# Patient Record
Sex: Female | Born: 1935 | Race: White | Hispanic: No | Smoking: Former smoker
Health system: Southern US, Community
[De-identification: ages and names within clinical notes are randomized; demographics above are authoritative.]

## PROBLEM LIST (undated history)

## (undated) DIAGNOSIS — C44300 Unspecified malignant neoplasm of skin of unspecified part of face: Secondary | ICD-10-CM

## (undated) DIAGNOSIS — F419 Anxiety disorder, unspecified: Secondary | ICD-10-CM

## (undated) DIAGNOSIS — I4891 Unspecified atrial fibrillation: Secondary | ICD-10-CM

## (undated) DIAGNOSIS — G479 Sleep disorder, unspecified: Secondary | ICD-10-CM

## (undated) DIAGNOSIS — I071 Rheumatic tricuspid insufficiency: Secondary | ICD-10-CM

## (undated) DIAGNOSIS — N2889 Other specified disorders of kidney and ureter: Secondary | ICD-10-CM

## (undated) DIAGNOSIS — I420 Dilated cardiomyopathy: Secondary | ICD-10-CM

## (undated) DIAGNOSIS — I340 Nonrheumatic mitral (valve) insufficiency: Secondary | ICD-10-CM

## (undated) HISTORY — PX: APPENDECTOMY: SHX54

## (undated) HISTORY — PX: ABDOMINAL HYSTERECTOMY: SHX81

## (undated) HISTORY — DX: Nonrheumatic mitral (valve) insufficiency: I34.0

## (undated) HISTORY — DX: Unspecified atrial fibrillation: I48.91

## (undated) HISTORY — DX: Rheumatic tricuspid insufficiency: I07.1

## (undated) HISTORY — DX: Dilated cardiomyopathy: I42.0

## (undated) HISTORY — DX: Sleep disorder, unspecified: G47.9

## (undated) HISTORY — DX: Unspecified malignant neoplasm of skin of unspecified part of face: C44.300

## (undated) HISTORY — DX: Anxiety disorder, unspecified: F41.9

## (undated) HISTORY — DX: Other specified disorders of kidney and ureter: N28.89

---

## 2012-02-19 DIAGNOSIS — I4891 Unspecified atrial fibrillation: Secondary | ICD-10-CM

## 2012-02-19 DIAGNOSIS — I420 Dilated cardiomyopathy: Secondary | ICD-10-CM

## 2012-02-19 DIAGNOSIS — N2889 Other specified disorders of kidney and ureter: Secondary | ICD-10-CM

## 2012-02-19 HISTORY — DX: Other specified disorders of kidney and ureter: N28.89

## 2012-02-19 HISTORY — DX: Dilated cardiomyopathy: I42.0

## 2012-02-19 HISTORY — DX: Unspecified atrial fibrillation: I48.91

## 2012-03-01 ENCOUNTER — Inpatient Hospital Stay: Payer: Self-pay | Admitting: Specialist

## 2012-03-01 LAB — URINALYSIS, COMPLETE
Bacteria: NONE SEEN
Bilirubin,UR: NEGATIVE
Blood: NEGATIVE
Glucose,UR: NEGATIVE mg/dL (ref 0–75)
Ketone: NEGATIVE
Leukocyte Esterase: NEGATIVE
Squamous Epithelial: <1
WBC UR: 1 /HPF (ref 0–5)

## 2012-03-01 LAB — COMPREHENSIVE METABOLIC PANEL
Alkaline Phosphatase: 90 U/L (ref 50–136)
BUN: 13 mg/dL (ref 7–18)
Calcium, Total: 8.9 mg/dL (ref 8.5–10.1)
Co2: 30 mmol/L (ref 21–32)
Creatinine: 0.81 mg/dL (ref 0.60–1.30)
EGFR (African American): >60
Glucose: 118 mg/dL — ABNORMAL HIGH (ref 65–99)
Potassium: 4.1 mmol/L (ref 3.5–5.1)
SGPT (ALT): 24 U/L

## 2012-03-01 LAB — DRUG SCREEN, URINE
Benzodiazepine, Ur Scrn: NEGATIVE (ref ?–200)
Cocaine Metabolite,Ur ~~LOC~~: NEGATIVE (ref ?–300)
Methadone, Ur Screen: NEGATIVE (ref ?–300)
Opiate, Ur Screen: NEGATIVE (ref ?–300)
Phencyclidine (PCP) Ur S: NEGATIVE (ref ?–25)
Tricyclic, Ur Screen: NEGATIVE (ref ?–1000)

## 2012-03-01 LAB — CBC
HGB: 12.8 g/dL (ref 12.0–16.0)
MCHC: 36.7 g/dL — ABNORMAL HIGH (ref 32.0–36.0)
MCV: 91 fL (ref 80–100)
RBC: 3.83 10*6/uL (ref 3.80–5.20)
RDW: 16.9 % — ABNORMAL HIGH (ref 11.5–14.5)
WBC: 6.3 10*3/uL (ref 3.6–11.0)

## 2012-03-01 LAB — APTT: Activated PTT: 28 secs (ref 23.6–35.9)

## 2012-03-01 LAB — ETHANOL: Ethanol %: <0.003 % (ref 0.000–0.080)

## 2012-03-01 LAB — TROPONIN I
Troponin-I: 0.02 ng/mL
Troponin-I: 0.04 ng/mL

## 2012-03-01 LAB — PROTIME-INR: INR: 1

## 2012-03-01 LAB — CK TOTAL AND CKMB (NOT AT ARMC): CK-MB: 2.6 ng/mL (ref 0.5–3.6)

## 2012-03-02 ENCOUNTER — Ambulatory Visit: Payer: Self-pay | Admitting: Urology

## 2012-03-02 LAB — CK TOTAL AND CKMB (NOT AT ARMC)
CK, Total: 25 U/L (ref 21–215)
CK-MB: 2.4 ng/mL (ref 0.5–3.6)

## 2012-03-02 LAB — CBC WITH DIFFERENTIAL/PLATELET
Basophil %: 0.7 %
Eosinophil #: 0 10*3/uL (ref 0.0–0.7)
Eosinophil %: 0.7 %
HCT: 30.4 % — ABNORMAL LOW (ref 35.0–47.0)
HGB: 10.8 g/dL — ABNORMAL LOW (ref 12.0–16.0)
Lymphocyte #: 0.6 10*3/uL — ABNORMAL LOW (ref 1.0–3.6)
MCHC: 35.4 g/dL (ref 32.0–36.0)
Monocyte #: 1.1 x10 3/mm — ABNORMAL HIGH (ref 0.2–0.9)
Neutrophil %: 68 %
RBC: 3.27 10*6/uL — ABNORMAL LOW (ref 3.80–5.20)
RDW: 17.1 % — ABNORMAL HIGH (ref 11.5–14.5)
WBC: 5.5 10*3/uL (ref 3.6–11.0)

## 2012-03-02 LAB — LIPID PANEL
Cholesterol: 127 mg/dL (ref 0–200)
HDL Cholesterol: 44 mg/dL (ref 40–60)
Ldl Cholesterol, Calc: 73 mg/dL (ref 0–100)
Triglycerides: 51 mg/dL (ref 0–200)
VLDL Cholesterol, Calc: 10 mg/dL (ref 5–40)

## 2012-03-02 LAB — BASIC METABOLIC PANEL
Anion Gap: 10 (ref 7–16)
Calcium, Total: 8.3 mg/dL — ABNORMAL LOW (ref 8.5–10.1)
Creatinine: 0.9 mg/dL (ref 0.60–1.30)
EGFR (African American): >60
EGFR (Non-African Amer.): 54 — ABNORMAL LOW
Osmolality: 273 (ref 275–301)

## 2012-03-02 LAB — TROPONIN I: Troponin-I: 0.04 ng/mL

## 2012-03-03 LAB — BASIC METABOLIC PANEL
Anion Gap: 8 (ref 7–16)
BUN: 27 mg/dL — ABNORMAL HIGH (ref 7–18)
Chloride: 92 mmol/L — ABNORMAL LOW (ref 98–107)
EGFR (African American): 49 — ABNORMAL LOW
EGFR (Non-African Amer.): 42 — ABNORMAL LOW
Glucose: 98 mg/dL (ref 65–99)
Potassium: 4.5 mmol/L (ref 3.5–5.1)

## 2012-03-06 ENCOUNTER — Emergency Department: Payer: Self-pay | Admitting: *Deleted

## 2012-03-06 LAB — CBC
HCT: 31.5 % — ABNORMAL LOW (ref 35.0–47.0)
HGB: 11.3 g/dL — ABNORMAL LOW (ref 12.0–16.0)
MCHC: 35.9 g/dL (ref 32.0–36.0)
MCV: 93 fL (ref 80–100)
Platelet: 289 10*3/uL (ref 150–440)

## 2012-03-06 LAB — COMPREHENSIVE METABOLIC PANEL
BUN: 33 mg/dL — ABNORMAL HIGH (ref 7–18)
Bilirubin,Total: 0.5 mg/dL (ref 0.2–1.0)
Chloride: 93 mmol/L — ABNORMAL LOW (ref 98–107)
Co2: 36 mmol/L — ABNORMAL HIGH (ref 21–32)
Creatinine: 0.87 mg/dL (ref 0.60–1.30)
Glucose: 106 mg/dL — ABNORMAL HIGH (ref 65–99)
Osmolality: 280 (ref 275–301)
Potassium: 4.3 mmol/L (ref 3.5–5.1)
SGOT(AST): 25 U/L (ref 15–37)
SGPT (ALT): 22 U/L

## 2012-03-06 LAB — PRO B NATRIURETIC PEPTIDE: B-Type Natriuretic Peptide: 4600 pg/mL — ABNORMAL HIGH (ref 0–450)

## 2012-03-07 DIAGNOSIS — N289 Disorder of kidney and ureter, unspecified: Secondary | ICD-10-CM

## 2012-03-07 DIAGNOSIS — G479 Sleep disorder, unspecified: Secondary | ICD-10-CM

## 2012-03-07 DIAGNOSIS — I428 Other cardiomyopathies: Secondary | ICD-10-CM

## 2012-03-07 DIAGNOSIS — I4891 Unspecified atrial fibrillation: Secondary | ICD-10-CM

## 2012-03-21 ENCOUNTER — Encounter: Payer: Self-pay | Admitting: Internal Medicine

## 2012-03-21 DIAGNOSIS — G479 Sleep disorder, unspecified: Secondary | ICD-10-CM | POA: Insufficient documentation

## 2012-03-21 DIAGNOSIS — I428 Other cardiomyopathies: Secondary | ICD-10-CM

## 2012-03-21 DIAGNOSIS — C44300 Unspecified malignant neoplasm of skin of unspecified part of face: Secondary | ICD-10-CM | POA: Insufficient documentation

## 2012-03-21 DIAGNOSIS — I4891 Unspecified atrial fibrillation: Secondary | ICD-10-CM

## 2012-03-21 DIAGNOSIS — N2889 Other specified disorders of kidney and ureter: Secondary | ICD-10-CM | POA: Insufficient documentation

## 2012-03-21 DIAGNOSIS — I420 Dilated cardiomyopathy: Secondary | ICD-10-CM | POA: Insufficient documentation

## 2012-03-22 ENCOUNTER — Telehealth: Payer: Self-pay | Admitting: *Deleted

## 2012-03-22 NOTE — Telephone Encounter (Signed)
Faxed form from [facility] is on your desk.  This is order for meds.

## 2012-03-25 NOTE — Telephone Encounter (Signed)
Hospice standing orders done

## 2012-04-02 ENCOUNTER — Ambulatory Visit (INDEPENDENT_AMBULATORY_CARE_PROVIDER_SITE_OTHER): Payer: Medicare Other | Admitting: Internal Medicine

## 2012-04-02 ENCOUNTER — Encounter: Payer: Self-pay | Admitting: Internal Medicine

## 2012-04-02 VITALS — BP 110/70 | HR 82 | Temp 98.2°F | Ht 63.0 in | Wt 135.0 lb

## 2012-04-02 DIAGNOSIS — I428 Other cardiomyopathies: Secondary | ICD-10-CM

## 2012-04-02 DIAGNOSIS — G479 Sleep disorder, unspecified: Secondary | ICD-10-CM

## 2012-04-02 DIAGNOSIS — C44319 Basal cell carcinoma of skin of other parts of face: Secondary | ICD-10-CM

## 2012-04-02 DIAGNOSIS — C44300 Unspecified malignant neoplasm of skin of unspecified part of face: Secondary | ICD-10-CM

## 2012-04-02 DIAGNOSIS — I420 Dilated cardiomyopathy: Secondary | ICD-10-CM

## 2012-04-02 DIAGNOSIS — N2889 Other specified disorders of kidney and ureter: Secondary | ICD-10-CM

## 2012-04-02 DIAGNOSIS — I4891 Unspecified atrial fibrillation: Secondary | ICD-10-CM

## 2012-04-02 DIAGNOSIS — G478 Other sleep disorders: Secondary | ICD-10-CM

## 2012-04-02 DIAGNOSIS — N289 Disorder of kidney and ureter, unspecified: Secondary | ICD-10-CM

## 2012-04-02 NOTE — Assessment & Plan Note (Signed)
Not inflamed Would involve at least 2 procedures---she wishes to just watch

## 2012-04-02 NOTE — Assessment & Plan Note (Signed)
Almost certainly RCC but no action is indicated or desired

## 2012-04-02 NOTE — Assessment & Plan Note (Signed)
Trazodone didn't really help 1 temazepam has been effective

## 2012-04-02 NOTE — Assessment & Plan Note (Signed)
Seems to be compensated now No changes needed Has 24/7 care and on hospice now

## 2012-04-02 NOTE — Assessment & Plan Note (Signed)
Good rate control on CCB Aspirin only with skin cancer

## 2012-04-02 NOTE — Progress Notes (Signed)
Subjective:    Patient ID: Elizabeth Glenn, female    DOB: 1914-08-19, 78 y.o.   MRN: [id number]  HPI Here with daughter No problems getting here  Very nice to be home again Still has sitter during day---daughter spends the night  Had some trouble sleeping  Trazodone changed to the temazepam---had bad nights with 2, but has done well with 1 Nocturia x 3  On the oxygen continuously No dyspnea while walking around house or showering (aide helps) Aide gives mostly stand by assist and help with pants in dressing No chest pain No palpitations  Current Outpatient Prescriptions on File Prior to Visit  Medication Sig Dispense Refill  . aspirin 81 MG tablet Take 81 mg by mouth daily.      Marland Kitchen diltiazem (CARDIZEM CD) 180 MG 24 hr capsule Take 180 mg by mouth daily.      . furosemide (LASIX) 20 MG tablet Take 40 mg by mouth daily.      Marland Kitchen HYDROcodone-acetaminophen (NORCO) 5-325 MG per tablet Take 1 tablet by mouth 3 (three) times daily as needed.      . nitroGLYCERIN (NITROSTAT) 0.4 MG SL tablet Place 0.4 mg under the tongue every 5 (five) minutes as needed. For chest pain      . potassium chloride (MICRO-K) 10 MEQ CR capsule Take 10 mEq by mouth daily.      . temazepam (RESTORIL) 15 MG capsule Take 15-30 mg by mouth at bedtime as needed.        No Known Allergies  Past Medical History  Diagnosis Date  . Atrial fibrillation 4/13  . Dilated cardiomyopathy 4/13  . Sleep disturbance   . Left renal mass 4/13  . Mitral regurgitation   . Tricuspid regurgitation   . Skin cancer of face     forehead    Past Surgical History  Procedure Date  . Appendectomy   . Abdominal hysterectomy     No family history on file.  History   Social History  . Marital Status: Widowed    Spouse Name: N/A    Number of Children: 4  . Years of Education: N/A   Occupational History  . Mill worker/grocery     Retired   Social History Main Topics  . Smoking status: Former Smoker    Types: Cigarettes    . Smokeless tobacco: Never Used  . Alcohol Use: No  . Drug Use: No  . Sexually Active: Not on file   Other Topics Concern  . Not on file   Social History Narrative   Widowed 1970 4 children--1 has diedDaughter [name] is health care POADNR done 4/18/13No living willLives alone with daughter there at night and sitter during day   Review of Systems No incontinence unless she waits too long Appetite variable Weight is down some more from [facility] instruction from hospice nurse on reduced salt diet Occ shooting pain in right calf since yesterday---worse this AM. Only lasts for a few minutes    Objective:   Physical Exam  Constitutional: She appears well-developed and well-nourished.  Neck: Normal range of motion.  Cardiovascular: Normal rate and normal heart sounds.  Exam reveals no gallop.   No murmur heard.      irregular  Pulmonary/Chest: Effort normal and breath sounds normal. No respiratory distress. She has no wheezes. She has no rales.  Abdominal: Soft. There is no tenderness.  Musculoskeletal: She exhibits edema.       Trace edema  No calf tenderness  Lymphadenopathy:    She has no cervical adenopathy.  Skin:       Large skin cancer on forehead---not inflamed  Psychiatric: She has a normal mood and affect. Her behavior is normal.          Assessment & Plan:

## 2012-04-10 ENCOUNTER — Telehealth: Payer: Self-pay | Admitting: Internal Medicine

## 2012-04-10 NOTE — Telephone Encounter (Signed)
Phone call from Elizabeth Glenn She has lost 36.5# over 18 days Appears to have cleared all edema Still feels okay  Asks about the furosemide Will change to prn if weight over 130# Check met b

## 2012-04-18 ENCOUNTER — Encounter: Payer: Self-pay | Admitting: Internal Medicine

## 2012-04-25 ENCOUNTER — Encounter: Payer: Self-pay | Admitting: *Deleted

## 2012-04-26 ENCOUNTER — Telehealth: Payer: Self-pay | Admitting: Internal Medicine

## 2012-04-26 NOTE — Telephone Encounter (Signed)
Call from [facility] nurse Still not sleeping despite trying trazodone and temazepam  Will try lorazepam---out of box---will send Rx if effective

## 2012-05-02 ENCOUNTER — Telehealth: Payer: Self-pay | Admitting: Internal Medicine

## 2012-05-02 NOTE — Telephone Encounter (Signed)
Call from [facility] ---hospice nurse Didn't sleep with the lorazepam They have had some success with elavil for restless legs--which she seems to have I okayed nortriptylline 25 which is safer at her age Can still use the temazepam prn

## 2012-05-27 ENCOUNTER — Telehealth: Payer: Self-pay | Admitting: Internal Medicine

## 2012-05-27 NOTE — Telephone Encounter (Signed)
Sue Lush with [facility] said that since Thursday patient has had foul odor with urine, chills, freq urine at night, and would like to know if they can do a U/A.

## 2012-05-27 NOTE — Telephone Encounter (Signed)
Discussed with Sue Lush Given potential systemic symptoms, will check urine but also start empiric cipro 250mg  bid (#20 x 0) Will stop if urine culture is negative

## 2012-05-29 ENCOUNTER — Encounter: Payer: Self-pay | Admitting: Internal Medicine

## 2012-06-03 ENCOUNTER — Telehealth: Payer: Self-pay | Admitting: Internal Medicine

## 2012-06-03 NOTE — Telephone Encounter (Signed)
Call from [facility] nurse Still not sleeping well--combination of insomnia and restless legs Lorazepam, amitriptylline, nortriptylline and vicodin not effective Some help from temazepam  I am not excited about requip Will try sinemet CR 25/100  1-2 at bedtime

## 2012-06-20 ENCOUNTER — Other Ambulatory Visit: Payer: Self-pay

## 2012-06-20 NOTE — Telephone Encounter (Signed)
Rx called in 

## 2012-06-20 NOTE — Telephone Encounter (Signed)
Okay #60 x 0 

## 2012-06-20 NOTE — Telephone Encounter (Signed)
Ok to refill 

## 2012-06-21 ENCOUNTER — Other Ambulatory Visit: Payer: Self-pay | Admitting: *Deleted

## 2012-06-21 DIAGNOSIS — R55 Syncope and collapse: Secondary | ICD-10-CM

## 2012-06-21 DIAGNOSIS — I472 Ventricular tachycardia: Secondary | ICD-10-CM

## 2012-06-21 DIAGNOSIS — I428 Other cardiomyopathies: Secondary | ICD-10-CM

## 2012-06-21 MED ORDER — LORAZEPAM 0.5 MG PO TABS: 0.5000 mg | ORAL_TABLET | Freq: Two times a day (BID) | ORAL | Status: DC | PRN

## 2012-06-21 NOTE — Telephone Encounter (Signed)
Medication phoned to pharmacy informing them that this may be a duplicate from yesterday but re-sent in case it did not transmit appropriately.

## 2012-06-21 NOTE — Telephone Encounter (Signed)
This was approved and phoned in yesterday Please check with the pharmacy to be sure they got it

## 2012-06-21 NOTE — Telephone Encounter (Signed)
OK to refill? No refill history on file.

## 2012-06-26 ENCOUNTER — Telehealth: Payer: Self-pay | Admitting: *Deleted

## 2012-06-26 NOTE — Telephone Encounter (Signed)
Hospice nurse calling asking for order to draw Met B on pt, she thinks her potassium is low, per nurse pt has had some syncopal episodes. Please advise  ( she knows your out until tomorrow)

## 2012-06-27 ENCOUNTER — Telehealth: Payer: Self-pay | Admitting: Internal Medicine

## 2012-06-27 NOTE — Telephone Encounter (Signed)
PC from [facility] RN Has had several episodes (4 in past week) of fluttering in chest then syncope Slumps over in chair ---then will recover BP running 116-120/68-76 Has appt next week  For now, will decrease diltiazem to 120 daily Labs next week

## 2012-06-27 NOTE — Telephone Encounter (Signed)
I spoke to her today See other phone note

## 2012-07-04 ENCOUNTER — Encounter: Payer: Self-pay | Admitting: Internal Medicine

## 2012-07-04 ENCOUNTER — Ambulatory Visit (INDEPENDENT_AMBULATORY_CARE_PROVIDER_SITE_OTHER): Payer: Medicare Other | Admitting: Internal Medicine

## 2012-07-04 VITALS — BP 132/70 | HR 90 | Temp 97.8°F | Wt 128.0 lb

## 2012-07-04 DIAGNOSIS — R55 Syncope and collapse: Secondary | ICD-10-CM

## 2012-07-04 DIAGNOSIS — G479 Sleep disorder, unspecified: Secondary | ICD-10-CM

## 2012-07-04 DIAGNOSIS — I420 Dilated cardiomyopathy: Secondary | ICD-10-CM

## 2012-07-04 DIAGNOSIS — I4891 Unspecified atrial fibrillation: Secondary | ICD-10-CM

## 2012-07-04 DIAGNOSIS — I428 Other cardiomyopathies: Secondary | ICD-10-CM

## 2012-07-04 NOTE — Assessment & Plan Note (Signed)
Does best with the temazepam The sinemet seems to have helped also

## 2012-07-04 NOTE — Assessment & Plan Note (Signed)
Rate reasonably controlled ASA only due to bleeding risk---esp with large cancer on forehead

## 2012-07-04 NOTE — Progress Notes (Signed)
Subjective:    Patient ID: Elizabeth Glenn, female    DOB: [birth year], 78 y.o.   MRN: [id number]  HPI Here with Elizabeth Glenn her daughter  Meredeth Ide a little with the walker or cane Ongoing right leg pain and swelling Does use the hydrocodone when needed  Multiple meds for sleep Nothing has worked so well Up three to four times a night to void and doesn't always go back to sleep easily Sinemet seems to help for leg restlessness---they occ give it during the day Does best at night with the temazepam  Last week, she had jumping feeling in chest Had a couple of spells of being unresponsive Daughter woke her by yelling in her face Diltiazem was decreased---no spells since then  Current Outpatient Prescriptions on File Prior to Visit  Medication Sig Dispense Refill  . aspirin 81 MG tablet Take 81 mg by mouth daily.      . carbidopa-levodopa (SINEMET CR) 25-100 MG per tablet Take 1 tablet by mouth 2 (two) times daily as needed. For restless legs      . diltiazem (DILACOR XR) 120 MG 24 hr capsule Take 120 mg by mouth daily.      Marland Kitchen HYDROcodone-acetaminophen (NORCO) 5-325 MG per tablet Take 1 tablet by mouth 3 (three) times daily as needed.      Marland Kitchen LORazepam (ATIVAN) 0.5 MG tablet Take 1 tablet (0.5 mg total) by mouth 2 (two) times daily as needed. For nerves  30 tablet  0  . nitroGLYCERIN (NITROSTAT) 0.4 MG SL tablet Place 0.4 mg under the tongue every 5 (five) minutes as needed. For chest pain      . temazepam (RESTORIL) 15 MG capsule Take 15 mg by mouth at bedtime as needed.        No Known Allergies  Past Medical History  Diagnosis Date  . Atrial fibrillation 4/13  . Dilated cardiomyopathy 4/13  . Sleep disturbance   . Left renal mass 4/13  . Mitral regurgitation   . Tricuspid regurgitation   . Skin cancer of face     forehead    Past Surgical History  Procedure Date  . Appendectomy   . Abdominal hysterectomy     No family history on file.  History   Social History  . Marital  Status: Widowed    Spouse Name: N/A    Number of Children: 4  . Years of Education: N/A   Occupational History  . Mill worker/grocery     Retired   Social History Main Topics  . Smoking status: Former Smoker    Types: Cigarettes  . Smokeless tobacco: Never Used  . Alcohol Use: No  . Drug Use: No  . Sexually Active: Not on file   Other Topics Concern  . Not on file   Social History Narrative   Widowed 1970 4 children--1 has diedDaughter [name] is health care POADNR done 4/18/13No living willLives alone with daughter there at night and sitter during day   Review of Systems Appetite is spotty Has lost a few pounds Not depressed    Objective:   Physical Exam  Constitutional: She appears well-developed and well-nourished. No distress.  HENT:       Same large tumor on forehead  Neck: Normal range of motion. No thyromegaly present.  Cardiovascular: Normal rate and normal heart sounds.  Exam reveals no gallop.   No murmur heard.      irregular  Pulmonary/Chest: Effort normal and breath sounds normal. No respiratory distress. She has  no wheezes. She has no rales.  Abdominal: Soft. There is no tenderness.  Musculoskeletal: She exhibits no edema and no tenderness.  Lymphadenopathy:    She has no cervical adenopathy.  Psychiatric: Her behavior is normal.       Mild anxiety          Assessment & Plan:

## 2012-07-04 NOTE — Assessment & Plan Note (Signed)
Stable but limited functional status

## 2012-07-04 NOTE — Assessment & Plan Note (Signed)
Sounds like hypotension related to increased heart rate Is better on lower diltiazem dose Discussed with daughter getting her supine if repeated spell

## 2012-07-05 LAB — TSH: TSH: 1.51 u[IU]/mL (ref 0.35–5.50)

## 2012-07-05 LAB — BASIC METABOLIC PANEL
BUN: 17 mg/dL (ref 6–23)
Calcium: 9 mg/dL (ref 8.4–10.5)
Creatinine, Ser: 0.5 mg/dL (ref 0.4–1.2)
GFR: 116.07 mL/min (ref >60.00)
Glucose, Bld: 96 mg/dL (ref 70–99)

## 2012-07-05 LAB — HEPATIC FUNCTION PANEL
Albumin: 3.3 g/dL — ABNORMAL LOW (ref 3.5–5.2)
Alkaline Phosphatase: 82 U/L (ref 39–117)
Bilirubin, Direct: 0.1 mg/dL (ref 0.0–0.3)
Total Bilirubin: 0.2 mg/dL — ABNORMAL LOW (ref 0.3–1.2)

## 2012-07-05 LAB — CBC WITH DIFFERENTIAL/PLATELET
Basophils Absolute: 0.1 10*3/uL (ref 0.0–0.1)
Lymphocytes Relative: 24.9 % (ref 12.0–46.0)
Lymphs Abs: 1.5 10*3/uL (ref 0.7–4.0)
Monocytes Relative: 15.4 % — ABNORMAL HIGH (ref 3.0–12.0)
Neutrophils Relative %: 55.6 % (ref 43.0–77.0)
Platelets: 183 10*3/uL (ref 150.0–400.0)
RDW: 14.8 % — ABNORMAL HIGH (ref 11.5–14.6)
WBC: 6 10*3/uL (ref 4.5–10.5)

## 2012-07-09 ENCOUNTER — Encounter: Payer: Self-pay | Admitting: *Deleted

## 2012-07-11 ENCOUNTER — Other Ambulatory Visit: Payer: Self-pay | Admitting: *Deleted

## 2012-07-11 MED ORDER — LORAZEPAM 0.5 MG PO TABS: 0.5000 mg | ORAL_TABLET | Freq: Two times a day (BID) | ORAL | Status: DC | PRN

## 2012-07-11 NOTE — Telephone Encounter (Signed)
rx called into pharmacy

## 2012-07-11 NOTE — Telephone Encounter (Signed)
Okay #60 x 0 

## 2012-07-11 NOTE — Telephone Encounter (Signed)
Last filled 06/20/2012

## 2012-07-24 ENCOUNTER — Other Ambulatory Visit: Payer: Self-pay | Admitting: *Deleted

## 2012-07-24 MED ORDER — TEMAZEPAM 15 MG PO CAPS: ORAL_CAPSULE | ORAL | Status: DC

## 2012-07-24 NOTE — Telephone Encounter (Signed)
Okay to change to 1-2 in instructions and give #60 x 0 Make sure she knows I recommend only 1 in general (but can use the 2 if having trouble)

## 2012-07-24 NOTE — Telephone Encounter (Signed)
The faxed request from the pharmacy has instructions as 1 to 2 capsules by mouth at bedtime while it only says 1 qhs prn in chart

## 2012-07-24 NOTE — Telephone Encounter (Signed)
Rx called in as directed. Patient and caregiver notified to use 2 caps sparingly and to try to use only 1 if possible. They verbalized understanding.

## 2012-08-14 ENCOUNTER — Telehealth: Payer: Self-pay | Admitting: *Deleted

## 2012-08-14 MED ORDER — DILTIAZEM HCL ER COATED BEADS 120 MG PO CP24: 120.0000 mg | ORAL_CAPSULE | Freq: Every day | ORAL | Status: DC

## 2012-08-14 NOTE — Telephone Encounter (Signed)
Okay to make that switch

## 2012-08-14 NOTE — Telephone Encounter (Signed)
Spoke with pharmacist and made the switch, rx sent to pharmacy by e-script

## 2012-08-14 NOTE — Telephone Encounter (Signed)
Pharmacist calling asking to change pt's med Diltiazem XR 120 to Cardizem CD, per pharmacist pt is having a hard time swallowing the Diltiazem because it's thicker, the Cardizem is thinner & the only difference is the Cardizem may fade out closer to the 24hr mark, other than that they are similar. please advise.

## 2012-08-21 ENCOUNTER — Ambulatory Visit: Payer: Self-pay | Admitting: Internal Medicine

## 2012-09-02 ENCOUNTER — Other Ambulatory Visit: Payer: Self-pay | Admitting: *Deleted

## 2012-09-02 MED ORDER — HYDROCODONE-ACETAMINOPHEN 5-325 MG PO TABS: 1.0000 | ORAL_TABLET | Freq: Three times a day (TID) | ORAL | Status: DC | PRN

## 2012-09-02 NOTE — Telephone Encounter (Signed)
LETVAK PATIENT, last filled 03/21/12, ok to fill?

## 2012-09-02 NOTE — Telephone Encounter (Signed)
plz phone in. 

## 2012-09-02 NOTE — Telephone Encounter (Signed)
rx called into pharmacy

## 2012-09-16 ENCOUNTER — Other Ambulatory Visit: Payer: Self-pay | Admitting: Internal Medicine

## 2012-09-17 NOTE — Telephone Encounter (Signed)
Okay #60 x 0 

## 2012-09-17 NOTE — Telephone Encounter (Signed)
rx called into pharmacy

## 2012-09-18 ENCOUNTER — Telehealth: Payer: Self-pay | Admitting: Internal Medicine

## 2012-09-18 ENCOUNTER — Other Ambulatory Visit: Payer: Self-pay | Admitting: Internal Medicine

## 2012-09-18 NOTE — Telephone Encounter (Signed)
Call from [facility] RN Having increased leg problems Tremor and pain---not just at night Doesn't seem like restless legs Hydrocodone and lorazepam some help  Not clear that sinemet helping with sleep  Advised to continue the meds for symptom relief. If ongoing symptoms, will push up appt from the 15th to next week

## 2012-09-19 NOTE — Telephone Encounter (Signed)
Just done Please check with the pharmacy

## 2012-09-20 NOTE — Telephone Encounter (Signed)
Called in Rx as prescribed 

## 2012-09-23 DIAGNOSIS — I428 Other cardiomyopathies: Secondary | ICD-10-CM

## 2012-09-23 DIAGNOSIS — R55 Syncope and collapse: Secondary | ICD-10-CM

## 2012-09-23 DIAGNOSIS — I472 Ventricular tachycardia: Secondary | ICD-10-CM

## 2012-09-24 ENCOUNTER — Ambulatory Visit (INDEPENDENT_AMBULATORY_CARE_PROVIDER_SITE_OTHER): Payer: Medicare Other | Admitting: Internal Medicine

## 2012-09-24 ENCOUNTER — Encounter: Payer: Self-pay | Admitting: Internal Medicine

## 2012-09-24 VITALS — BP 138/80 | HR 84 | Temp 97.9°F | Wt 121.0 lb

## 2012-09-24 DIAGNOSIS — I428 Other cardiomyopathies: Secondary | ICD-10-CM

## 2012-09-24 DIAGNOSIS — N289 Disorder of kidney and ureter, unspecified: Secondary | ICD-10-CM

## 2012-09-24 DIAGNOSIS — F419 Anxiety disorder, unspecified: Secondary | ICD-10-CM | POA: Insufficient documentation

## 2012-09-24 DIAGNOSIS — N2889 Other specified disorders of kidney and ureter: Secondary | ICD-10-CM

## 2012-09-24 DIAGNOSIS — M79609 Pain in unspecified limb: Secondary | ICD-10-CM

## 2012-09-24 DIAGNOSIS — I420 Dilated cardiomyopathy: Secondary | ICD-10-CM

## 2012-09-24 DIAGNOSIS — M79606 Pain in leg, unspecified: Secondary | ICD-10-CM | POA: Insufficient documentation

## 2012-09-24 DIAGNOSIS — F411 Generalized anxiety disorder: Secondary | ICD-10-CM

## 2012-09-24 DIAGNOSIS — I4891 Unspecified atrial fibrillation: Secondary | ICD-10-CM

## 2012-09-24 DIAGNOSIS — Z23 Encounter for immunization: Secondary | ICD-10-CM

## 2012-09-24 MED ORDER — HYDROCODONE-ACETAMINOPHEN 5-325 MG PO TABS: 1.0000 | ORAL_TABLET | Freq: Three times a day (TID) | ORAL | Status: DC | PRN

## 2012-09-24 NOTE — Assessment & Plan Note (Signed)
May be multifactorial ?anxiety base/restless legs Using lorazepam and hydrocodone

## 2012-09-24 NOTE — Assessment & Plan Note (Signed)
Rate is okay now Does go fast at times but seems controlled on the diltiazem On ASA only due to bleeding risk

## 2012-09-24 NOTE — Assessment & Plan Note (Signed)
Still with Class 3 heart failure symptoms Stable fluid status On diltiazem

## 2012-09-24 NOTE — Addendum Note (Signed)
Addended by: Sueanne Margarita on: 09/24/2012 10:18 AM   Modules accepted: Orders

## 2012-09-24 NOTE — Progress Notes (Signed)
Subjective:    Patient ID: Elizabeth Glenn, female    DOB: 24-Jul-1914, 78 y.o.   MRN: [id number]  HPI Here with daughter Doing "half and half"  Having increased problems with legs Moving and don't stop  More of a daytime issue---legs will just shake and have awful pain Trembles all over Usually occurs after anxiety acts up---at least lately Will use the lorazepam and the hydrocodone with some relief  Sleep is still not great Legs not a problem with the sinemet and temazepam  Does get nervous at times The lorazepam does help some  No chest pain Heart seems to run fast at times--feels better if they lie her back then Breathing is okay No sig edema mostly  Walks some in house with walker Hospice bathes her twice a week Needs help dressing or she gets dyspneic Independent with toilet use  Current Outpatient Prescriptions on File Prior to Visit  Medication Sig Dispense Refill  . aspirin 81 MG tablet Take 81 mg by mouth daily.      . carbidopa-levodopa (SINEMET CR) 25-100 MG per tablet Take 1 tablet by mouth 2 (two) times daily as needed. For restless legs      . diltiazem (CARDIZEM CD) 120 MG 24 hr capsule Take 1 capsule (120 mg total) by mouth daily.  30 capsule  11  . HYDROcodone-acetaminophen (NORCO/VICODIN) 5-325 MG per tablet Take 1 tablet by mouth 3 (three) times daily as needed.  30 tablet  0  . LORazepam (ATIVAN) 0.5 MG tablet Take 1 tablet (0.5 mg total) by mouth 2 (two) times daily as needed. For nerves  60 tablet  0  . nitroGLYCERIN (NITROSTAT) 0.4 MG SL tablet Place 0.4 mg under the tongue every 5 (five) minutes as needed. For chest pain      . temazepam (RESTORIL) 15 MG capsule TAKE 1 TO 2 CAPSULES BY MOUTH AT BEDTIME  60 capsule  0    No Known Allergies  Past Medical History  Diagnosis Date  . Atrial fibrillation 4/13  . Dilated cardiomyopathy 4/13  . Sleep disturbance   . Left renal mass 4/13  . Mitral regurgitation   . Tricuspid regurgitation   . Skin cancer  of face     forehead    Past Surgical History  Procedure Date  . Appendectomy   . Abdominal hysterectomy     No family history on file.  History   Social History  . Marital Status: Widowed    Spouse Name: N/A    Number of Children: 4  . Years of Education: N/A   Occupational History  . Mill worker/grocery     Retired   Social History Main Topics  . Smoking status: Former Smoker    Types: Cigarettes  . Smokeless tobacco: Never Used  . Alcohol Use: No  . Drug Use: No  . Sexually Active: Not on file   Other Topics Concern  . Not on file   Social History Narrative   Widowed 1970 4 children--1 has diedDaughter Elizabeth Glenn is health care POADNR done 4/18/13No living willLives alone with daughter there at night and sitter during day   Review of Systems Eating fair--just nibbles per daughter Weight is down 7# since last visit     Objective:   Physical Exam  Constitutional: She appears well-developed. No distress.  Neck: Normal range of motion. Neck supple.  Cardiovascular: Normal rate and normal heart sounds.  Exam reveals no gallop.   No murmur heard.  Irregular Faint distal pulses  Pulmonary/Chest: Effort normal and breath sounds normal. No respiratory distress. She has no wheezes. She has no rales.  Abdominal: Soft. There is no tenderness.  Musculoskeletal: She exhibits no edema and no tenderness.  Lymphadenopathy:    She has no cervical adenopathy.  Psychiatric: She has a normal mood and affect. Her behavior is normal.          Assessment & Plan:

## 2012-09-24 NOTE — Assessment & Plan Note (Signed)
No further evaluation planned

## 2012-09-24 NOTE — Assessment & Plan Note (Signed)
Regular problems that seem to cause the leg pain (or contribute) Using the lorazepam and temazepam at night

## 2012-10-04 ENCOUNTER — Ambulatory Visit: Payer: PRIVATE HEALTH INSURANCE | Admitting: Internal Medicine

## 2012-10-06 ENCOUNTER — Other Ambulatory Visit: Payer: Self-pay | Admitting: Internal Medicine

## 2012-10-07 NOTE — Telephone Encounter (Signed)
rx called into pharmacy

## 2012-10-07 NOTE — Telephone Encounter (Signed)
Okay #60 x 0 

## 2012-10-14 ENCOUNTER — Telehealth: Payer: Self-pay

## 2012-10-14 NOTE — Telephone Encounter (Signed)
Elizabeth Glenn with [facility] left v/m to advise pts certification for hospice is to end on 11/16/12; end of 6 months of care and based on pts diagnosis cardiomyopathy with no significant changes or decline. Pt will need to see Dr Alphonsus Sias 2 weeks prior to 11/16/12 for f/u of pulse ox and need for oxygen at 2.5 L continuously in the home. Any questions or concerns about plan call [facility]. [name], pts daughter to schedule appt for oxygen verification.

## 2012-10-14 NOTE — Telephone Encounter (Signed)
Okay Will need to document oximetry in office to justify oxygen Rx

## 2012-10-24 ENCOUNTER — Other Ambulatory Visit: Payer: Self-pay | Admitting: *Deleted

## 2012-10-24 MED ORDER — MORPHINE SULFATE (CONCENTRATE) 20 MG/ML PO SOLN: ORAL | Status: DC

## 2012-10-24 MED ORDER — HYDROCODONE-ACETAMINOPHEN 5-325 MG PO TABS: 1.0000 | ORAL_TABLET | Freq: Three times a day (TID) | ORAL | Status: DC | PRN

## 2012-10-24 NOTE — Telephone Encounter (Signed)
rx [facility] to [facility] [address]

## 2012-10-30 ENCOUNTER — Other Ambulatory Visit: Payer: Self-pay | Admitting: *Deleted

## 2012-10-30 MED ORDER — CARBIDOPA-LEVODOPA CR 25-100 MG PO TBCR: 1.0000 | EXTENDED_RELEASE_TABLET | Freq: Two times a day (BID) | ORAL | Status: DC | PRN

## 2012-10-30 NOTE — Telephone Encounter (Signed)
Okay #60 x 3 

## 2012-10-30 NOTE — Telephone Encounter (Signed)
rx called into pharmacy

## 2012-10-31 ENCOUNTER — Encounter: Payer: Self-pay | Admitting: Internal Medicine

## 2012-10-31 ENCOUNTER — Ambulatory Visit (INDEPENDENT_AMBULATORY_CARE_PROVIDER_SITE_OTHER): Payer: Self-pay | Admitting: Internal Medicine

## 2012-10-31 VITALS — BP 158/80 | HR 99 | Temp 97.8°F | Wt 120.0 lb

## 2012-10-31 DIAGNOSIS — F419 Anxiety disorder, unspecified: Secondary | ICD-10-CM

## 2012-10-31 DIAGNOSIS — F411 Generalized anxiety disorder: Secondary | ICD-10-CM

## 2012-10-31 DIAGNOSIS — G479 Sleep disorder, unspecified: Secondary | ICD-10-CM

## 2012-10-31 DIAGNOSIS — C44300 Unspecified malignant neoplasm of skin of unspecified part of face: Secondary | ICD-10-CM

## 2012-10-31 DIAGNOSIS — N2889 Other specified disorders of kidney and ureter: Secondary | ICD-10-CM

## 2012-10-31 DIAGNOSIS — I4891 Unspecified atrial fibrillation: Secondary | ICD-10-CM

## 2012-10-31 DIAGNOSIS — I420 Dilated cardiomyopathy: Secondary | ICD-10-CM

## 2012-10-31 DIAGNOSIS — I428 Other cardiomyopathies: Secondary | ICD-10-CM

## 2012-10-31 DIAGNOSIS — N289 Disorder of kidney and ureter, unspecified: Secondary | ICD-10-CM

## 2012-10-31 DIAGNOSIS — C44319 Basal cell carcinoma of skin of other parts of face: Secondary | ICD-10-CM

## 2012-10-31 MED ORDER — DILTIAZEM HCL ER COATED BEADS 180 MG PO TB24: 180.0000 mg | ORAL_TABLET | Freq: Every day | ORAL | Status: AC

## 2012-10-31 NOTE — Assessment & Plan Note (Signed)
Chronic nerves Uses the lorazepam prn

## 2012-10-31 NOTE — Assessment & Plan Note (Signed)
Has restless legs that sinemet, temazepam and hydrocodone seem to be helping Will continue this regimen

## 2012-10-31 NOTE — Assessment & Plan Note (Signed)
Looks about the same

## 2012-10-31 NOTE — Progress Notes (Signed)
Subjective:    Patient ID: Elizabeth Glenn, female    DOB: [birth year], 78 y.o.   MRN: [id number]  HPI Here with daughter Discussed with Marie---hospice nurse Going to keep her on hospice due to recent decline Has had more trouble with her restless legs--- night and day Hydrocodone helps On the temazepam--- will sleep with only 2-3 night awakenings  meds don't seem to be causing sedation  Heart is racing some times She senses it and "it scares me" SOB at times---when too fast Some chest burning but no distinct pain  No ankles swelling Uses wedge to sleep Has had some PND---has to sit straight up  Once or twice a week-- or more  Current Outpatient Prescriptions on File Prior to Visit  Medication Sig Dispense Refill  . aspirin 81 MG tablet Take 81 mg by mouth daily.      . carbidopa-levodopa (SINEMET CR) 25-100 MG per tablet Take 1 tablet by mouth 2 (two) times daily as needed. For restless legs  60 tablet  3  . diltiazem (CARDIZEM CD) 120 MG 24 hr capsule Take 1 capsule (120 mg total) by mouth daily.  30 capsule  11  . HYDROcodone-acetaminophen (NORCO/VICODIN) 5-325 MG per tablet Take 1 tablet by mouth 3 (three) times daily as needed. HOSPICE PATIENT  90 tablet  0  . LORazepam (ATIVAN) 0.5 MG tablet TAKE 1 TABLET TWICE A DAY AS NEEDED FOR NERVES  60 tablet  0  . morphine (ROXANOL) 20 MG/ML concentrated solution Take 5-10 mg by mouth every 1-2 hours as needed for pain HOSPICE PATIENT  30 mL  0  . nitroGLYCERIN (NITROSTAT) 0.4 MG SL tablet Place 0.4 mg under the tongue every 5 (five) minutes as needed. For chest pain      . temazepam (RESTORIL) 15 MG capsule TAKE 1 TO 2 CAPSULES BY MOUTH AT BEDTIME  60 capsule  0    No Known Allergies  Past Medical History  Diagnosis Date  . Atrial fibrillation 4/13  . Dilated cardiomyopathy 4/13  . Sleep disturbance   . Left renal mass 4/13  . Mitral regurgitation   . Tricuspid regurgitation   . Skin cancer of face     forehead  . Anxiety      Past Surgical History  Procedure Date  . Appendectomy   . Abdominal hysterectomy     No family history on file.  History   Social History  . Marital Status: Widowed    Spouse Name: N/A    Number of Children: 4  . Years of Education: N/A   Occupational History  . Mill worker/grocery     Retired   Social History Main Topics  . Smoking status: Former Smoker    Types: Cigarettes  . Smokeless tobacco: Never Used  . Alcohol Use: No  . Drug Use: No  . Sexually Active: Not on file   Other Topics Concern  . Not on file   Social History Narrative   Widowed 1970 4 children--1 has diedDaughter [name] is health care POADNR done 4/18/13No living willLives alone with daughter there at night and sitter during day   Review of Systems Appetite is not very good Weight is down a little more-- 14# down in past 6 months or so    Objective:   Physical Exam  Constitutional: She appears well-developed and well-nourished. No distress.  Neck: Normal range of motion.  Cardiovascular: Normal heart sounds.  Exam reveals no gallop.   No murmur heard.  Irregular and tachycardic ~120 with apical pulse  Pulmonary/Chest: Effort normal and breath sounds normal. No respiratory distress. She has no wheezes. She has no rales.  Abdominal: Soft. There is no tenderness.       No obvious mass  Musculoskeletal: She exhibits no edema and no tenderness.  Lymphadenopathy:    She has no cervical adenopathy.  Psychiatric: She has a normal mood and affect. Her behavior is normal.          Assessment & Plan:

## 2012-10-31 NOTE — Assessment & Plan Note (Signed)
Not sure if this is active No further testing is planned

## 2012-10-31 NOTE — Assessment & Plan Note (Signed)
I suspect her decompensation may be most from the tachycardia No fluid retention Increase in diltiazem

## 2012-10-31 NOTE — Assessment & Plan Note (Signed)
Rapid rate now Clear symptoms Will increase the diltiazem to 180 Will stay on hospice due to decline in function

## 2012-10-31 NOTE — Patient Instructions (Signed)
Please increase the diltiazem to 180mg  per day I will plan to make a home visit in 2-3 months

## 2012-11-01 ENCOUNTER — Telehealth: Payer: Self-pay

## 2012-11-01 NOTE — Telephone Encounter (Signed)
Elizabeth Glenn, pts daughter pt saw Dr Alphonsus Sias 10/31/12; changed BP med. This morning gave pt Diltiazem 24 hr ER 180 mg tab. 15 mins after pt took med at 8:30-9:00 am pt was pounding in chest, could see pts heart beating in her chest, pt said was very sick, weak voice, pt could not stay awake, nausea for 1 -1.5 hours. Pt went back to sleep and now pt is feeling OK and acting normal. If pts condition changes or worsens [name] will call office back. [name] wants to know if should give Diltiazem again.[facility]. [name] request call back.

## 2012-11-01 NOTE — Telephone Encounter (Signed)
Please let her know this is the same medicine that she has been on, just a higher dose.  She might want to check with the pharmacist to see if it is a different formulation that could cause a problem She should continue the med though

## 2012-11-01 NOTE — Telephone Encounter (Signed)
Spoke with daughter and advised results. She will call if any problems.

## 2012-11-15 ENCOUNTER — Other Ambulatory Visit: Payer: Self-pay | Admitting: Internal Medicine

## 2012-11-16 NOTE — Telephone Encounter (Signed)
Okay #60 x 0 

## 2012-11-18 NOTE — Telephone Encounter (Signed)
rx called into pharmacy

## 2012-11-25 ENCOUNTER — Other Ambulatory Visit: Payer: Self-pay | Admitting: Internal Medicine

## 2012-11-25 DIAGNOSIS — I472 Ventricular tachycardia: Secondary | ICD-10-CM

## 2012-11-25 DIAGNOSIS — I428 Other cardiomyopathies: Secondary | ICD-10-CM

## 2012-11-25 DIAGNOSIS — R55 Syncope and collapse: Secondary | ICD-10-CM

## 2012-11-25 NOTE — Telephone Encounter (Signed)
Okay #90 x 0 

## 2012-11-25 NOTE — Telephone Encounter (Signed)
rx called into pharmacy

## 2012-12-23 ENCOUNTER — Ambulatory Visit: Payer: Medicare Other | Admitting: Internal Medicine

## 2012-12-25 ENCOUNTER — Ambulatory Visit: Payer: Medicare Other | Admitting: Internal Medicine

## 2012-12-31 ENCOUNTER — Other Ambulatory Visit: Payer: Self-pay | Admitting: *Deleted

## 2012-12-31 NOTE — Telephone Encounter (Signed)
Last filled 10/23/13 

## 2013-01-01 MED ORDER — HYDROCODONE-ACETAMINOPHEN 5-325 MG PO TABS: 1.0000 | ORAL_TABLET | Freq: Three times a day (TID) | ORAL | Status: DC | PRN

## 2013-01-01 NOTE — Telephone Encounter (Signed)
Okay #90 x 0 

## 2013-01-01 NOTE — Telephone Encounter (Signed)
rx called into pharmacy

## 2013-01-03 ENCOUNTER — Telehealth: Payer: Self-pay | Admitting: Internal Medicine

## 2013-01-03 MED ORDER — MORPHINE SULFATE (CONCENTRATE) 20 MG/ML PO SOLN: ORAL | Status: AC

## 2013-01-03 MED ORDER — MORPHINE SULFATE ER 15 MG PO TBCR: 15.0000 mg | EXTENDED_RELEASE_TABLET | Freq: Two times a day (BID) | ORAL | Status: DC

## 2013-01-03 NOTE — Telephone Encounter (Signed)
Call from [facility] RN  Having more pain--chest and other. Different for her Hydrocodone not helping roxanol did help but needs long acting---[facility]  [facility]  [phone]

## 2013-01-15 ENCOUNTER — Encounter: Payer: Self-pay | Admitting: Internal Medicine

## 2013-01-15 ENCOUNTER — Ambulatory Visit (INDEPENDENT_AMBULATORY_CARE_PROVIDER_SITE_OTHER): Payer: Self-pay | Admitting: Internal Medicine

## 2013-01-15 VITALS — BP 128/78 | HR 80 | Resp 16 | Wt 98.0 lb

## 2013-01-15 DIAGNOSIS — F419 Anxiety disorder, unspecified: Secondary | ICD-10-CM

## 2013-01-15 DIAGNOSIS — I420 Dilated cardiomyopathy: Secondary | ICD-10-CM

## 2013-01-15 DIAGNOSIS — C44319 Basal cell carcinoma of skin of other parts of face: Secondary | ICD-10-CM

## 2013-01-15 DIAGNOSIS — F05 Delirium due to known physiological condition: Secondary | ICD-10-CM | POA: Insufficient documentation

## 2013-01-15 DIAGNOSIS — C44300 Unspecified malignant neoplasm of skin of unspecified part of face: Secondary | ICD-10-CM

## 2013-01-15 DIAGNOSIS — I428 Other cardiomyopathies: Secondary | ICD-10-CM

## 2013-01-15 DIAGNOSIS — F411 Generalized anxiety disorder: Secondary | ICD-10-CM

## 2013-01-15 DIAGNOSIS — G479 Sleep disorder, unspecified: Secondary | ICD-10-CM

## 2013-01-15 DIAGNOSIS — I4891 Unspecified atrial fibrillation: Secondary | ICD-10-CM

## 2013-01-15 NOTE — Assessment & Plan Note (Signed)
Drains a little but no substantial difference

## 2013-01-15 NOTE — Assessment & Plan Note (Signed)
With apparent anginal type syndrome Overall decline  Pain/dyspnea better with the morphine ?side effects with the long acting (though lower dose than the roxanol she had been giving)

## 2013-01-15 NOTE — Assessment & Plan Note (Signed)
Has had stress with sister's death She is the last one in the family left--had hoped to die with her sister Ready to go now (but not really depressed)

## 2013-01-15 NOTE — Assessment & Plan Note (Signed)
Heart rate is better now Will continue current diltiazem

## 2013-01-15 NOTE — Assessment & Plan Note (Addendum)
Could she have had concussion with last week's fall? Related to starting long acting morphine?  Can try with just the roxanol for 2 days just to be sure Try to limit the lorazepam first

## 2013-01-15 NOTE — Progress Notes (Signed)
Subjective:    Patient ID: Elizabeth Glenn, female    DOB: Dec 24, 1913, 79 y.o.   MRN: [id number]  HPI First home visit Daughter is here and 2 of her caregivers Marie--hospice nurse Has 24 hour care---3 caregivers share 9 hours per day---family here other times  Has continued to lose weight Not eating great Drinks ensure regularly--discussed adding ice cream (and they have added egg beaters for protein)  Had been having more dyspnea Chest and other pains---getting racing and fluttering in chest Some leg pain as well Did seem to improve with the roxanol See phone note---started the long acting morphine Fell 3 days after starting the morphine ER--tripped on oxygen cord at night. Then "out of it" the next morning Got confused after that---slept a lot again after that. Then had a good day 2 days ago (may have gotten a lot of the extra morphine in addition to the ER) Bad day yesterday  Memory has been spotty Forgets she had a bath soon after having it Now with some incontinence---has resisted getting care  Stressful time lately Lost her last sister and travelled to [place] for the funeral recently "I am ready to go"  Not walking well--needs help. Assisted to bathroom---needs help even when getting there Stand by assist for dressing (and sometimes more) Clearly getting weaker  Current Outpatient Prescriptions on File Prior to Visit  Medication Sig Dispense Refill  . aspirin 81 MG tablet Take 81 mg by mouth daily.      Marland Kitchen diltiazem (CARDIZEM LA) 180 MG 24 hr tablet Take 1 tablet (180 mg total) by mouth daily.  30 tablet  11  . LORazepam (ATIVAN) 0.5 MG tablet TAKE 1 TABLET TWICE A DAY AS NEEDED FOR NERVES  60 tablet  0  . morphine (MS CONTIN) 15 MG 12 hr tablet Take 1 tablet (15 mg total) by mouth 2 (two) times daily.  30 tablet  0  . morphine (ROXANOL) 20 MG/ML concentrated solution Take 5-10 mg by mouth every 1-2 hours as needed for pain HOSPICE PATIENT  30 mL  0  . nitroGLYCERIN  (NITROSTAT) 0.4 MG SL tablet Place 0.4 mg under the tongue every 5 (five) minutes as needed. For chest pain      . temazepam (RESTORIL) 15 MG capsule TAKE 1 TO 2 CAPSULES BY MOUTH EVERY NIGHT  60 capsule  0   No current facility-administered medications on file prior to visit.    No Known Allergies  Past Medical History  Diagnosis Date  . Atrial fibrillation 4/13  . Dilated cardiomyopathy 4/13  . Sleep disturbance   . Left renal mass 4/13  . Mitral regurgitation   . Tricuspid regurgitation   . Skin cancer of face     forehead  . Anxiety     Past Surgical History  Procedure Laterality Date  . Appendectomy    . Abdominal hysterectomy      No family history on file.  History   Social History  . Marital Status: Widowed    Spouse Name: N/A    Number of Children: 4  . Years of Education: N/A   Occupational History  . Mill worker/grocery     Retired   Social History Main Topics  . Smoking status: Former Smoker    Types: Cigarettes  . Smokeless tobacco: Never Used  . Alcohol Use: No  . Drug Use: No  . Sexually Active: Not on file   Other Topics Concern  . Not on file   Social History  Narrative   Widowed 1970    4 children--1 has died   Daughter [name] is health care POA   DNR done 03/07/12   No living will      Lives alone with daughter there at night and sitter during day   Review of Systems Rash on bottom has cleared---used dermacloud till then Uses senna -s to help bowels since being on increased narcotics Sleeping better but still has difficulty    Objective:   Physical Exam  Constitutional: No distress.  HOH but engages normally  Neck: No thyromegaly present.  Cardiovascular: Normal rate.  Exam reveals no gallop.   No murmur heard. irregular  Pulmonary/Chest: Effort normal. No respiratory distress. She has no wheezes. She has no rales.  Decreased breath sounds at bases but no dullness to percussion  Abdominal: Soft. There is no  tenderness.  Lymphadenopathy:    She has no cervical adenopathy.  Neurological:  Generalized weakness Normal tone  Skin:  Forehead skin cancer without sig change  Psychiatric:  Feels ready for her time but not really depressed          Assessment & Plan:

## 2013-01-15 NOTE — Assessment & Plan Note (Signed)
Has done better with the temazepam

## 2013-01-16 ENCOUNTER — Telehealth: Payer: Self-pay

## 2013-01-16 NOTE — Telephone Encounter (Signed)
Left message with results, advised to call if any problems

## 2013-01-16 NOTE — Telephone Encounter (Signed)
Lillia Abed with [facility] left v/m for Dr Alphonsus Sias; Hilda Lias reweighed pt 01/15/13 and weight was 105 lbs. Pt's caregiver told Hilda Lias after Dr Alphonsus Sias left that pt was having foul smelling and dark urine. Hilda Lias will collect a urine specimen today for urinalysis and C/S and will fax results to Dr Alphonsus Sias. Hilda Lias does not need call back.

## 2013-01-16 NOTE — Telephone Encounter (Signed)
Okay Let her know that I am at a conference tomorrow but she can page me with results if Rx is indicated

## 2013-01-21 ENCOUNTER — Other Ambulatory Visit: Payer: Self-pay | Admitting: Family Medicine

## 2013-01-21 MED ORDER — MORPHINE SULFATE ER 15 MG PO TBCR: 15.0000 mg | EXTENDED_RELEASE_TABLET | Freq: Two times a day (BID) | ORAL | Status: DC

## 2013-01-21 NOTE — Telephone Encounter (Signed)
rx faxed to pharmacy manually Spoke with hospice nurse and advised results

## 2013-01-21 NOTE — Telephone Encounter (Signed)
Please fax right to pharmacy

## 2013-01-21 NOTE — Telephone Encounter (Signed)
Elizabeth Glenn from Hospice received message from pt's daughter this AM stating that pt has taken last dose of MS Contin and needs refill sent today.  She has no more medication for her evening dose.

## 2013-01-28 ENCOUNTER — Telehealth: Payer: Self-pay

## 2013-01-28 NOTE — Telephone Encounter (Signed)
Crystal with [facility] left v/m f/u on faxed urinalysis & urine culture report to Dr Alphonsus Sias. Crystal wants to verify that Dr Alphonsus Sias received the lab results.Please advise.

## 2013-01-30 ENCOUNTER — Other Ambulatory Visit: Payer: Self-pay | Admitting: *Deleted

## 2013-01-30 MED ORDER — TEMAZEPAM 15 MG PO CAPS: ORAL_CAPSULE | ORAL | Status: AC

## 2013-01-30 NOTE — Telephone Encounter (Signed)
Please call in

## 2013-01-30 NOTE — Telephone Encounter (Signed)
Last filled 12/29/12

## 2013-01-31 ENCOUNTER — Other Ambulatory Visit: Payer: Self-pay

## 2013-01-31 MED ORDER — MORPHINE SULFATE ER 15 MG PO TBCR: 15.0000 mg | EXTENDED_RELEASE_TABLET | Freq: Two times a day (BID) | ORAL | Status: AC

## 2013-01-31 NOTE — Telephone Encounter (Signed)
Spoke with cystal hospice nurse and advised results, she will try and get another UA & culture, I asked her to have the results faxed to [phone] so I may get them quicker.

## 2013-01-31 NOTE — Telephone Encounter (Signed)
Med printed to fax for hospice Looks like her ua was abn but then ucx showed mixed urogenital flora, which indicates contamination

## 2013-01-31 NOTE — Telephone Encounter (Signed)
Dr.Tower could you also look at the UA & culture results for this patient of Dr.Asad Keeven's , per hospice they originally faxed over these results on 01/20/2013, we did not receive them and they are wanting to know something ASAP, please advise  Results on your desk

## 2013-01-31 NOTE — Telephone Encounter (Signed)
SEE REFILL REQUEST/ UA/ CULTURE RESULTS PHONE NOTE DATED 01/31/2013

## 2013-01-31 NOTE — Telephone Encounter (Signed)
Rx phoned to pharmacy.  

## 2013-01-31 NOTE — Telephone Encounter (Signed)
Elizabeth Glenn with [facility] request refill morphine 15 mg sent to [facility]. Pt's family said will run out before end of weekend. Elizabeth Glenn request sent in today and Elizabeth Glenn request call back when done.Please advise.

## 2013-02-01 NOTE — Telephone Encounter (Signed)
Unless she was having specific urinary complaints---dysuria, hematuria, etc---I would not have recommended repeating the urine testing

## 2013-02-03 DIAGNOSIS — I428 Other cardiomyopathies: Secondary | ICD-10-CM

## 2013-02-03 DIAGNOSIS — R55 Syncope and collapse: Secondary | ICD-10-CM

## 2013-02-03 DIAGNOSIS — I472 Ventricular tachycardia: Secondary | ICD-10-CM

## 2013-02-03 NOTE — Telephone Encounter (Signed)
Per Crystal pt is confused and she already collected the urine

## 2013-02-04 ENCOUNTER — Telehealth: Payer: Self-pay | Admitting: Internal Medicine

## 2013-02-04 ENCOUNTER — Encounter: Payer: Self-pay | Admitting: Internal Medicine

## 2013-02-04 NOTE — Telephone Encounter (Signed)
Phone call from Hilda Lias the hospice nurse She has worsened More agitated---may be terminal restlessness Being transferred today to the [facility]

## 2013-02-18 DEATH — deceased

## 2013-03-31 IMAGING — CT CT CHEST W/ CM
1 of 2 series · 15 of 32 positions shown, 19 images · IV contrast (agent unspecified)
Comparison: None

REASON FOR EXAM: left sided effusion vs infiltrate
COMMENTS:

PROCEDURE:     CT  - CT CHEST WITH CONTRAST  - March 01, 2012  [DATE]
RESULT:     Indication: Left-sided pleural effusion
TECHNIQUE: Multiple axial images of the chest are obtained with 50 mL of
Csovue-WVV intravenous contrast.

[Series 2: soft tissue · axial · 0.62mm/px · z∈[-286,-32]mm · 15 of 57 slices shown, 19 images]
[im 3/57  soft-tissue]
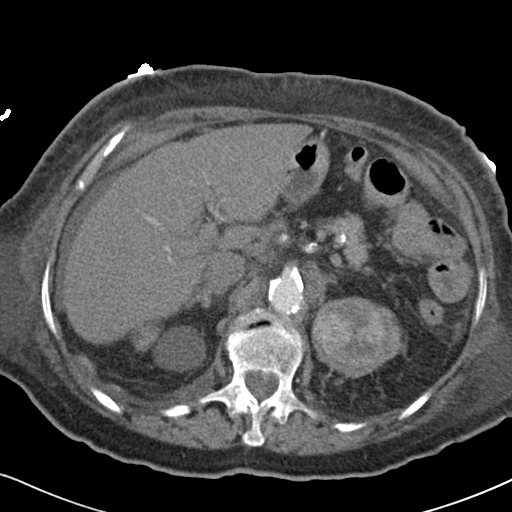
[im 3/57  bone]
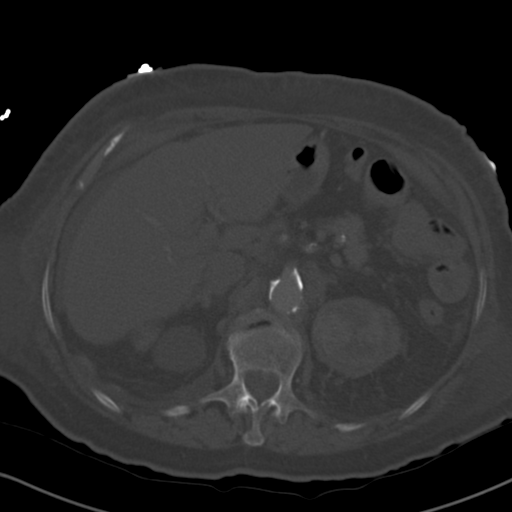
[im 7/57  soft-tissue]
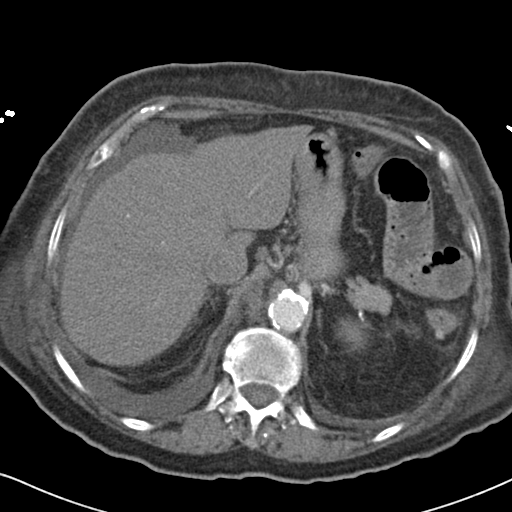
[im 11/57  soft-tissue]
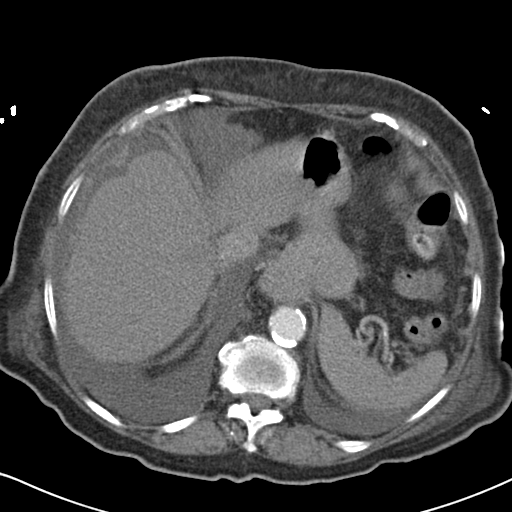
[im 16/57  soft-tissue]
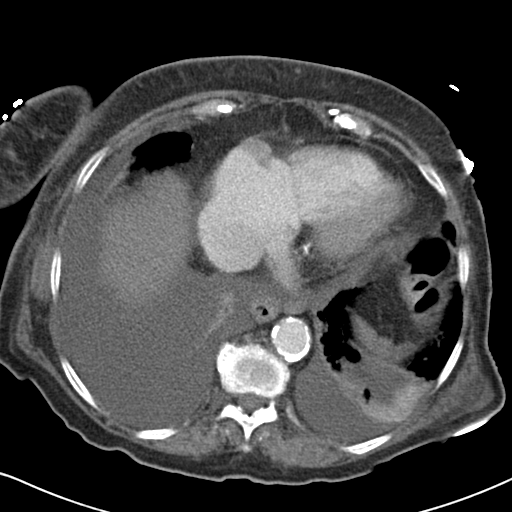
[im 20/57  soft-tissue]
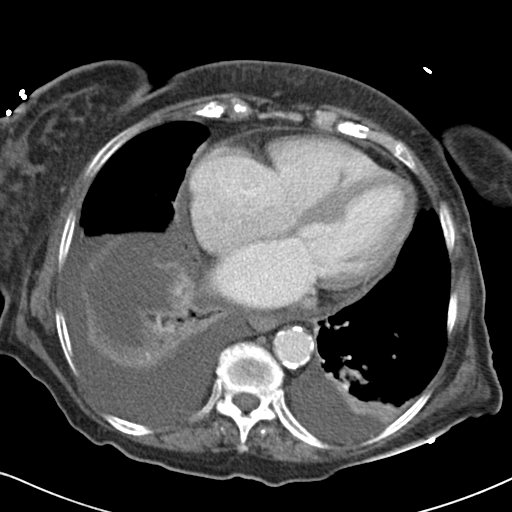
[im 24/57  soft-tissue]
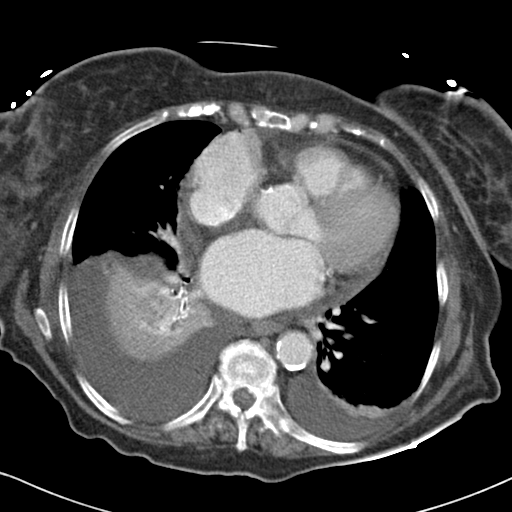
[im 29/57  soft-tissue]
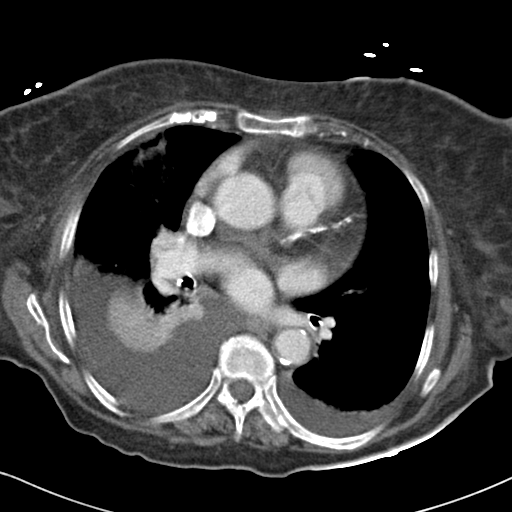
[im 33/57  soft-tissue]
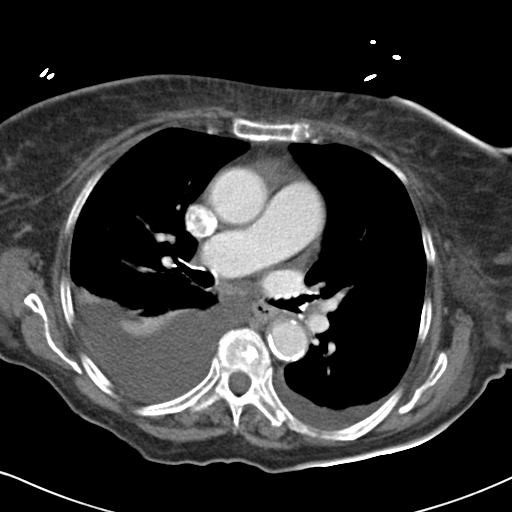
[im 37/57  soft-tissue]
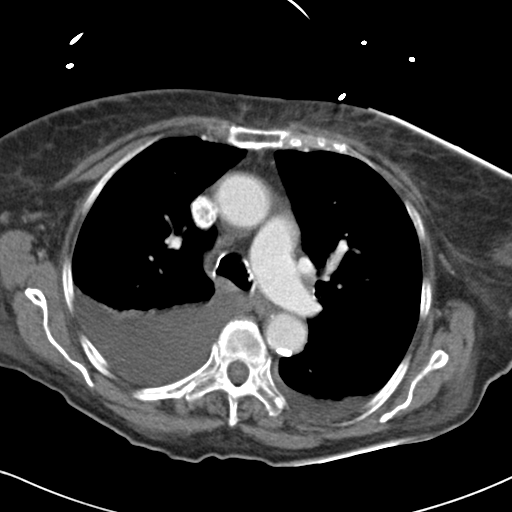
[im 37/57  bone]
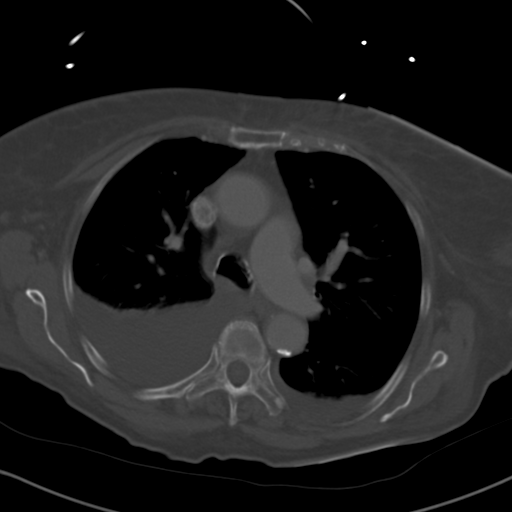
[im 41/57  soft-tissue]
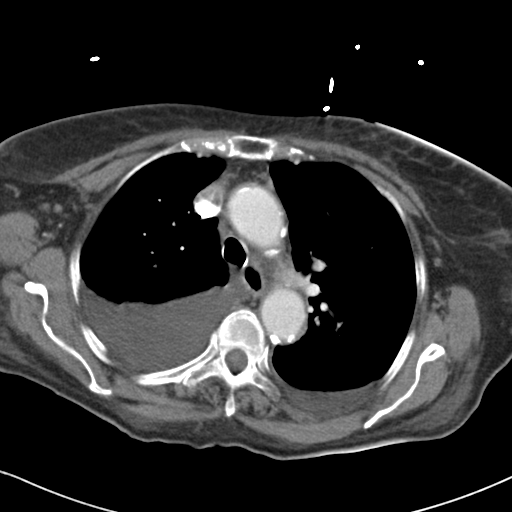
[im 46/57  soft-tissue]
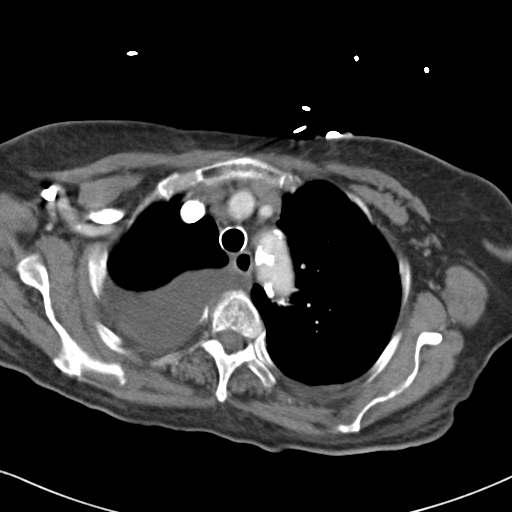
[im 48/57  lung]
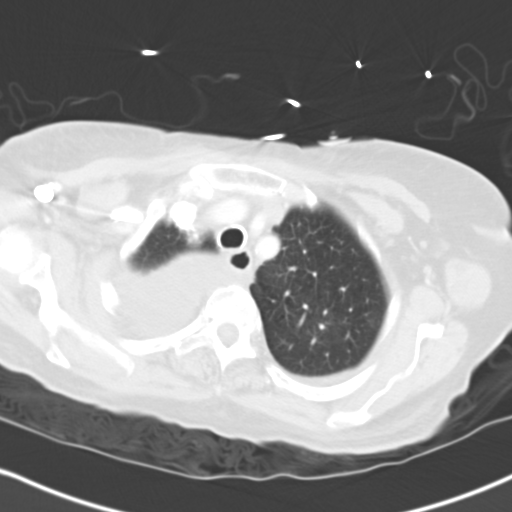
[im 50/57  soft-tissue]
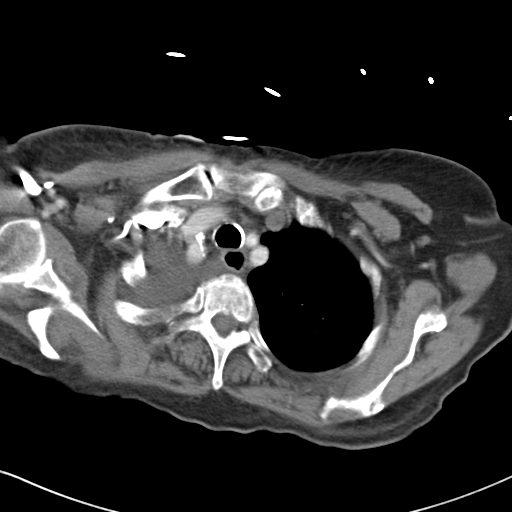
[im 50/57  lung]
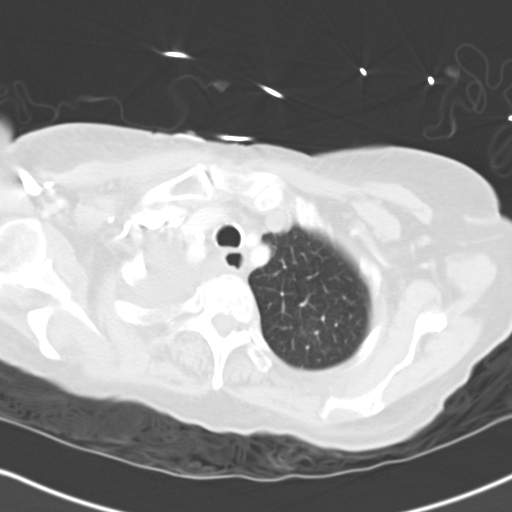
[im 52/57  lung]
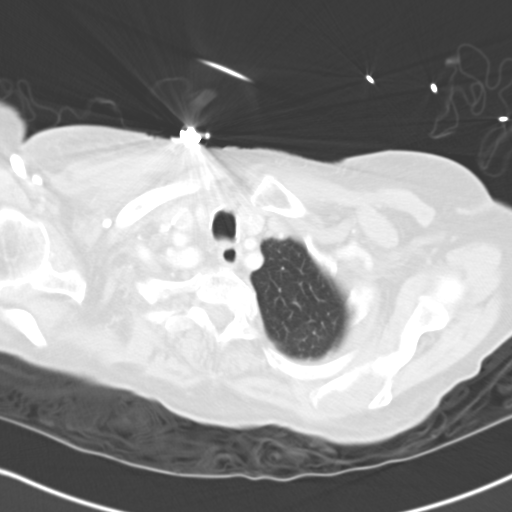
[im 54/57  soft-tissue]
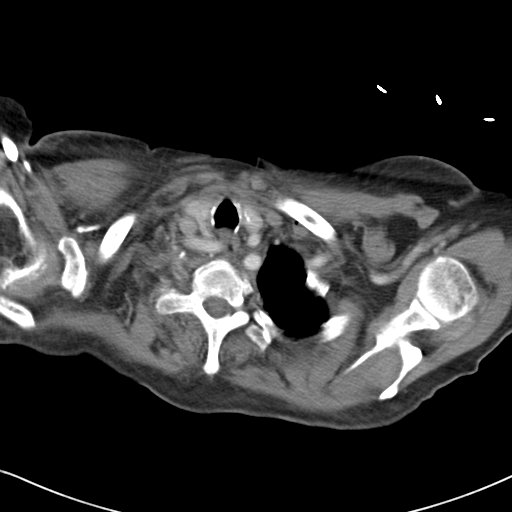
[im 54/57  lung]
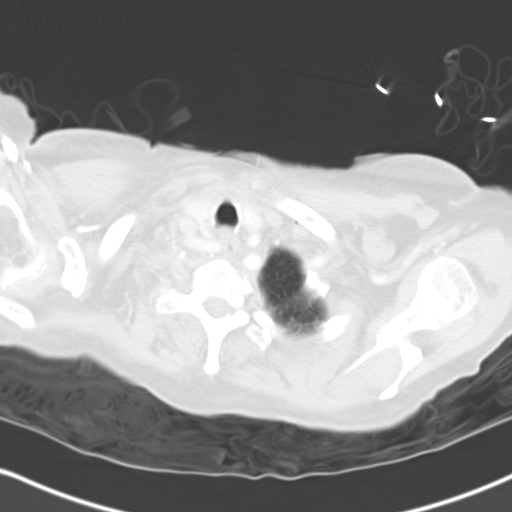

[15 of 32 positions shown; findings below may reference images not displayed]

FINDINGS: The central airways are patent. There is a moderate right pleural effusion
with associated compressive atelectasis. There is a small left pleural
effusion. There is no pneumothorax.

There are no pathologically enlarged axillary, hilar, or mediastinal lymph
nodes.

The heart size is normal. There is no pericardial effusion. The thoracic
aorta is normal in caliber.  There is coronary artery atherosclerosis
involving the left main, LAD and circumflex coronary artery.

Review of bone windows demonstrates no focal lytic or sclerotic lesions.

Limited noncontrast images of the upper abdomen were obtained. The adrenal
glands appear normal. There is a 5.8 x 4.8 cm heterogeneous enhancing left
upper pole renal mass most concerning for renal cell carcinoma.
IMPRESSION: 1. Moderate right pleural effusion and small left pleural effusion with
associated compressive atelectasis.

2. There is a 5.8 x 4.8 cm heterogeneous enhancing left upper pole renal
mass most concerning for renal cell carcinoma.

## 2013-03-31 IMAGING — CR DG CHEST 1V PORT
1 series · 1 of 1 positions shown · non-contrast
Comparison: none

REASON FOR EXAM: aloc, syncopal, htn
COMMENTS:

[portable]
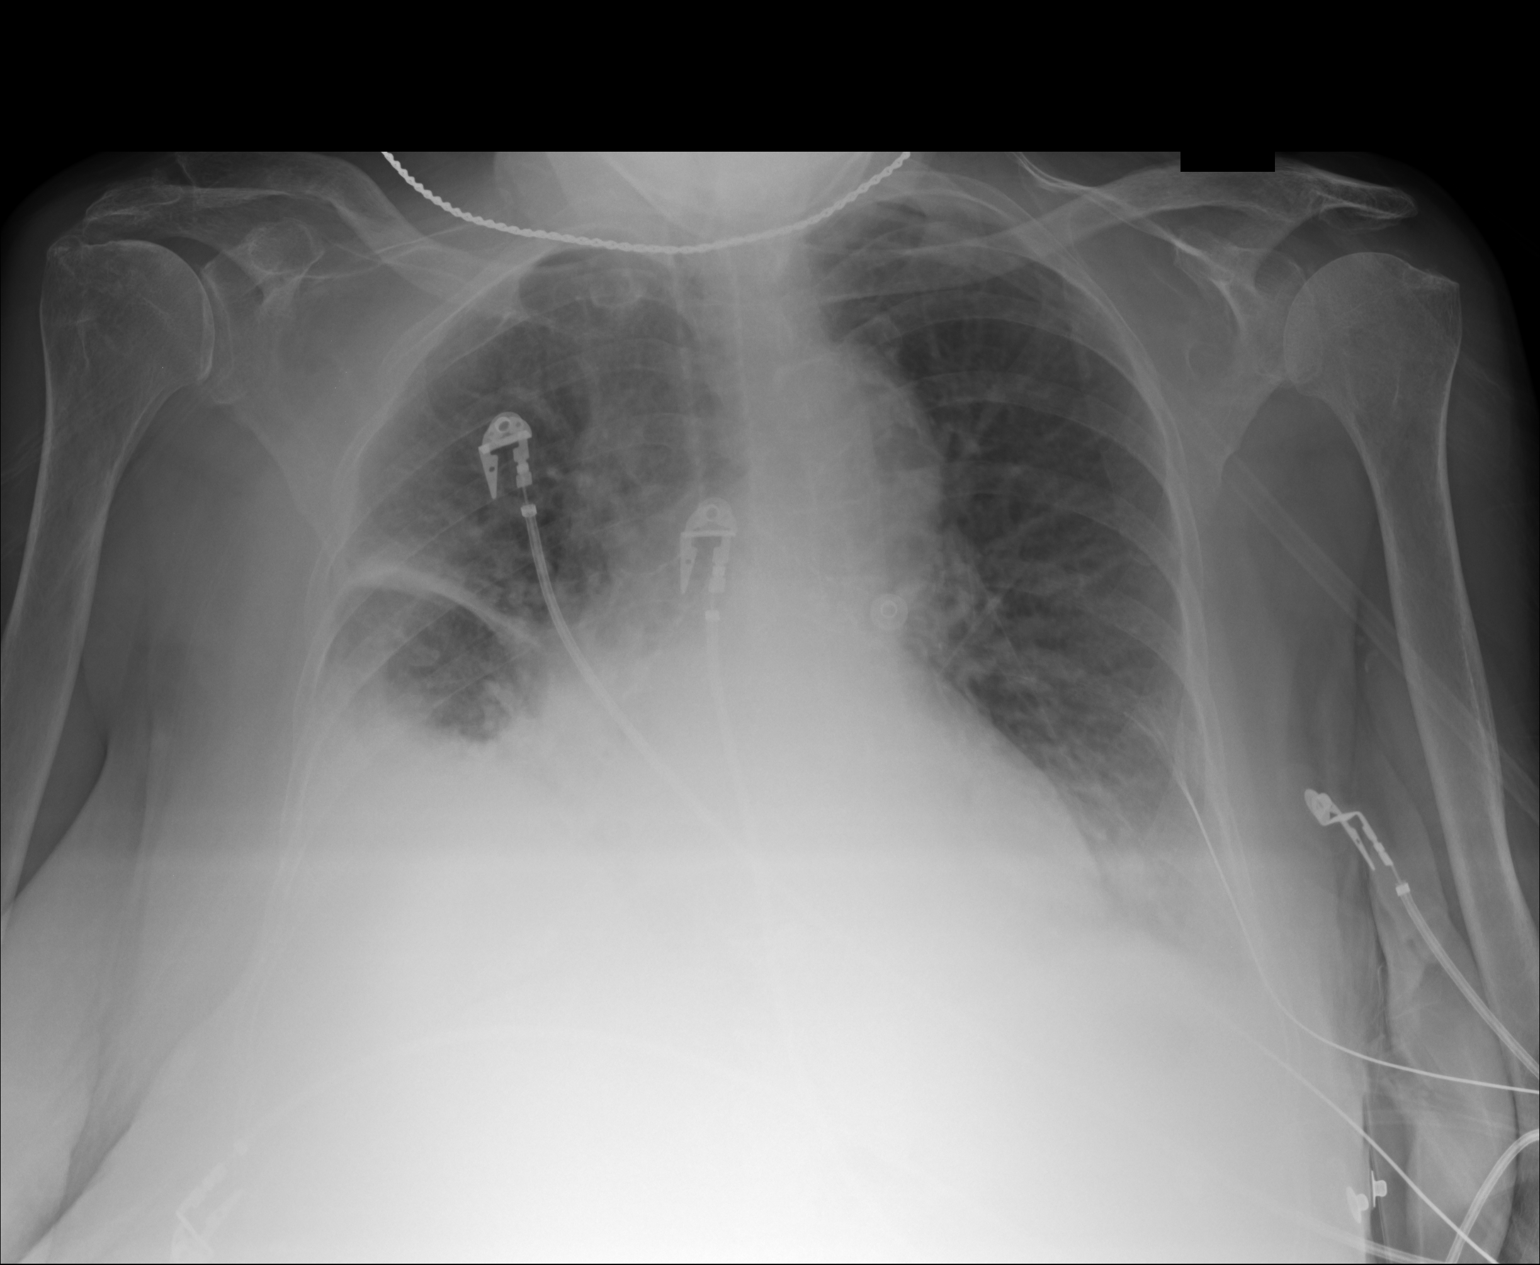

[1 of 1 positions shown; findings below may reference images not displayed]

PROCEDURE:     DXR - DXR PORTABLE CHEST SINGLE VIEW  - March 01, 2012  [DATE]

RESULT:     Portable AP view of the chest shows increased density at the
right lung base compatible with a right pleural effusion. There is also
noted fluid in the minor fissure on the right. Associated infiltrate or
atelectasis could also be present in the right base and obscured by the
effusion. No pulmonary edema is seen. Heart size is upper limits for normal.
IMPRESSION: 1.     There is increased density at the right base consistent with a right
pleural effusion. The possibility of underlying infiltrate or atelectasis
cannot be excluded although the entire density could be secondary to fluid.
The etiology for the effusion is not identified.

## 2013-04-01 IMAGING — US US CAROTID DUPLEX BILAT
1 series · 17 of 24 positions shown · non-contrast
Comparison: none

REASON FOR EXAM: syncope
COMMENTS:

[Series 1: us carotid duplex bilat · 17 of 82 slices shown]
[im 1/82]
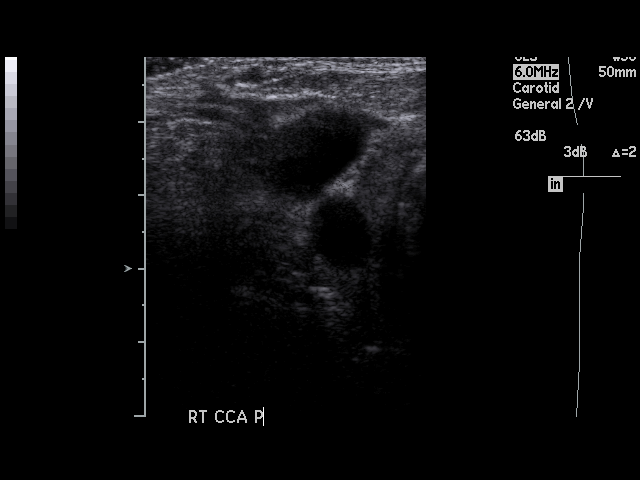
[im 8/82]
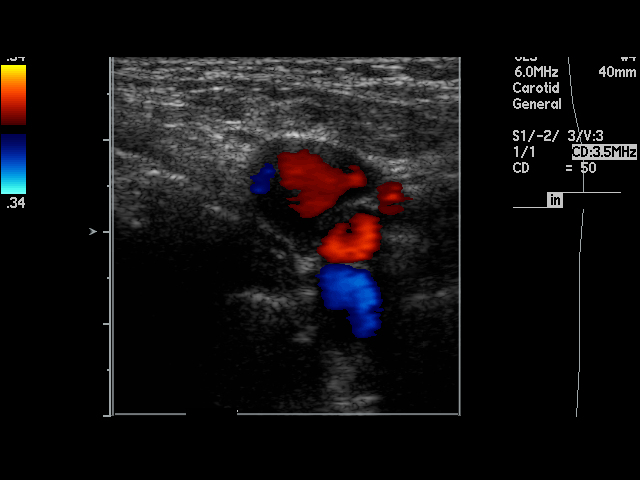
[im 11/82]
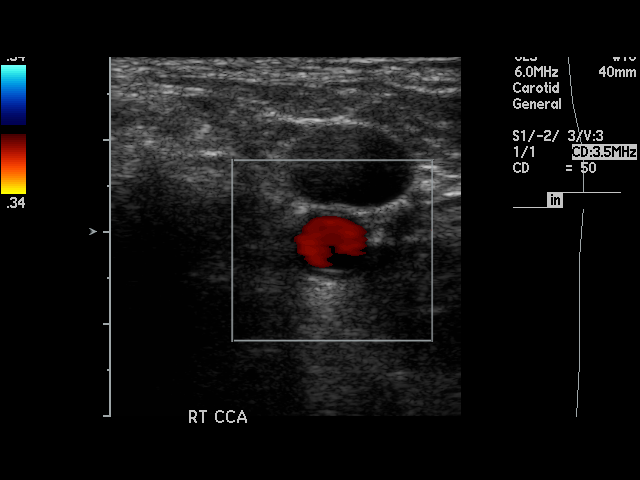
[im 15/82]
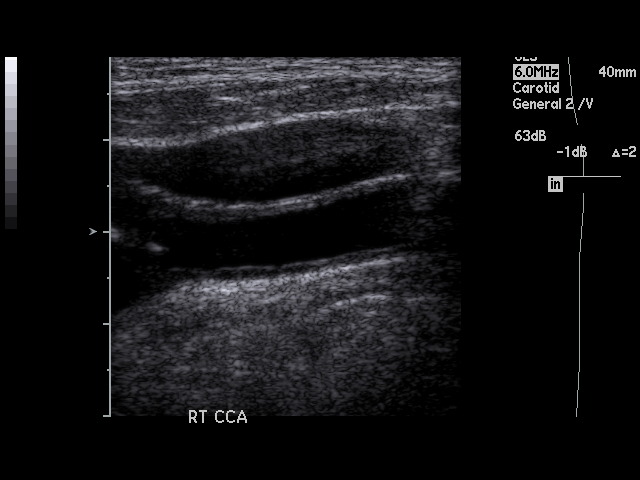
[im 22/82]
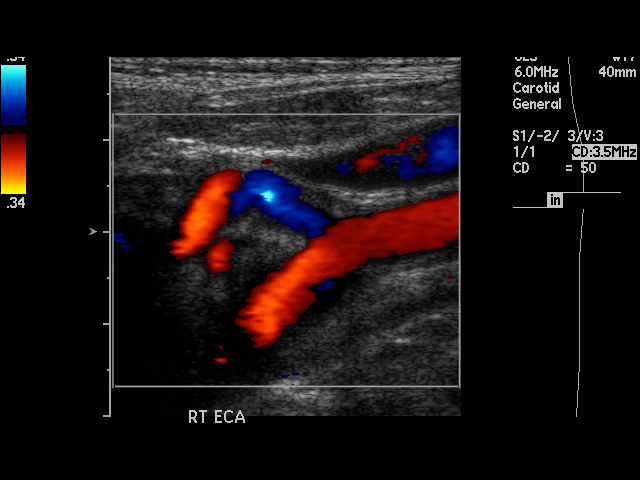
[im 25/82]
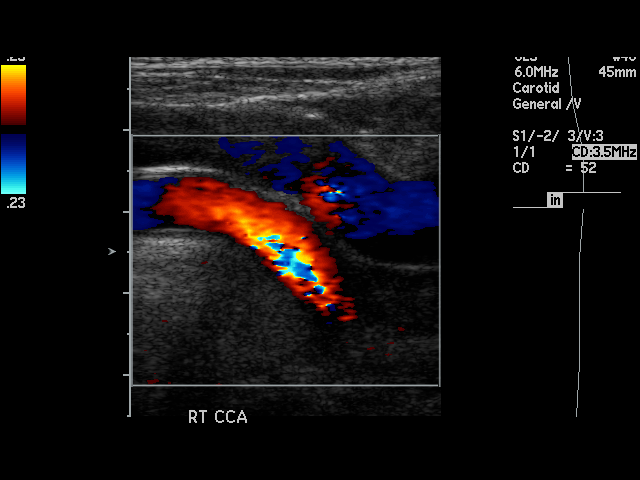
[im 32/82]
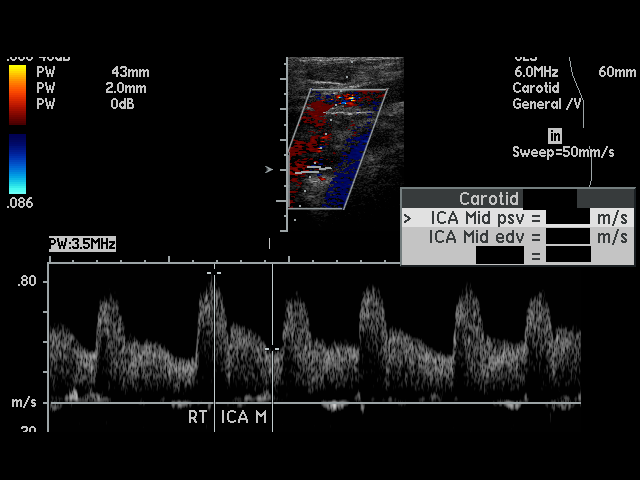
[im 36/82]
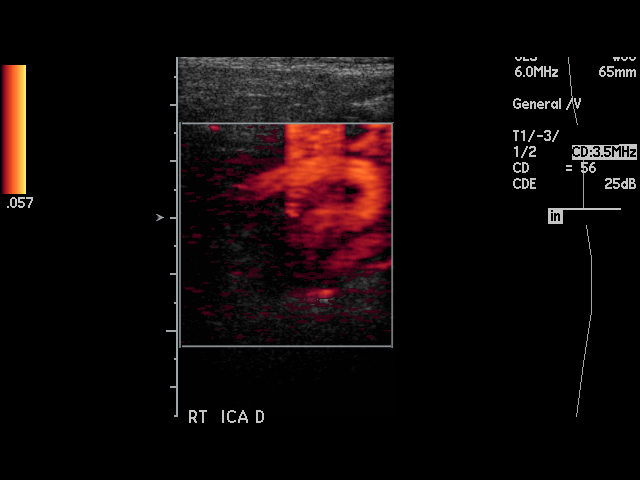
[im 43/82]
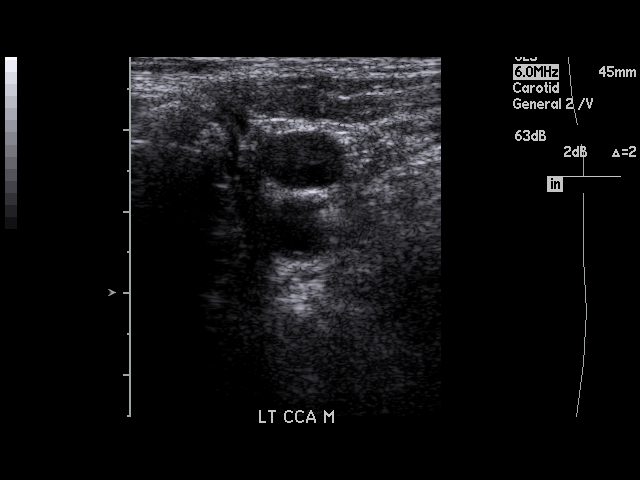
[im 46/82]
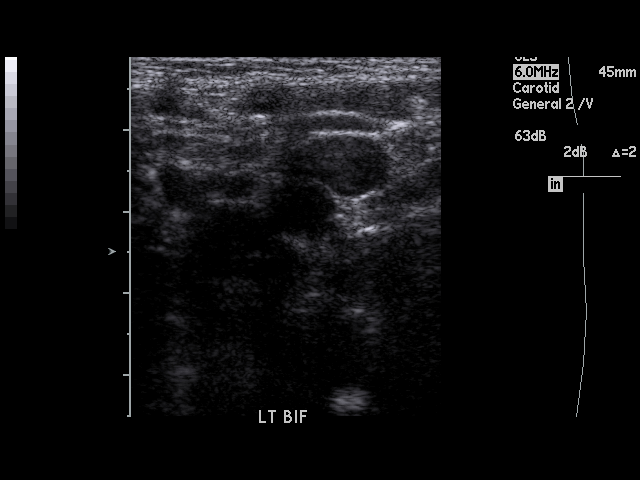
[im 50/82]
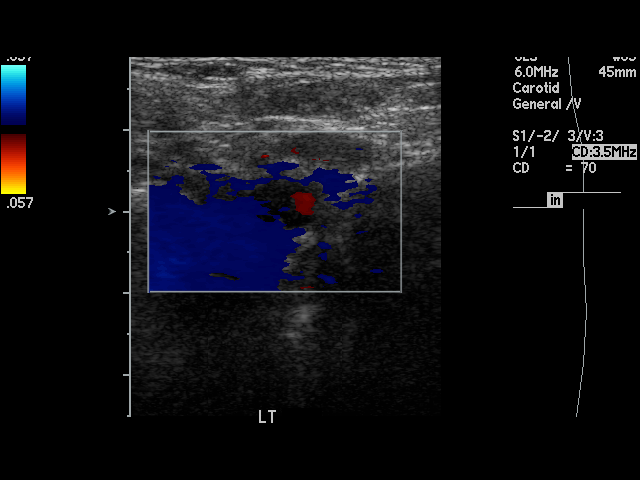
[im 57/82]
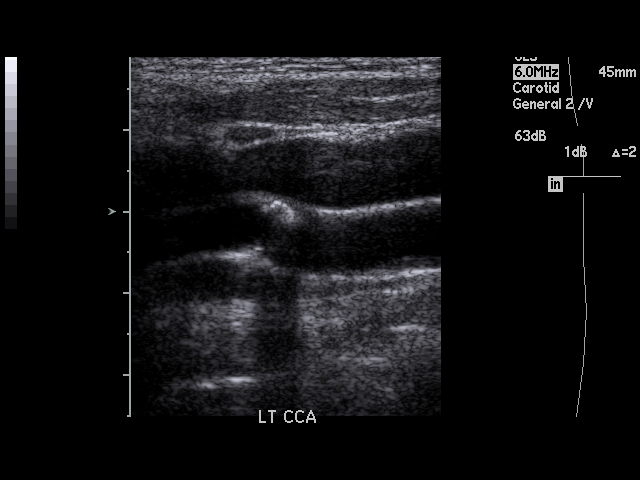
[im 60/82]
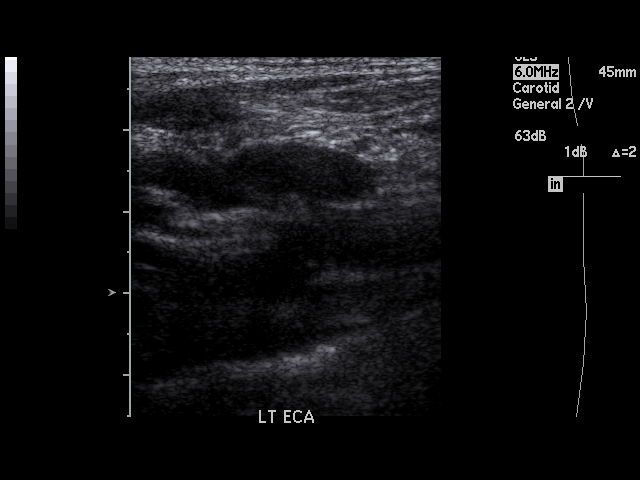
[im 67/82]
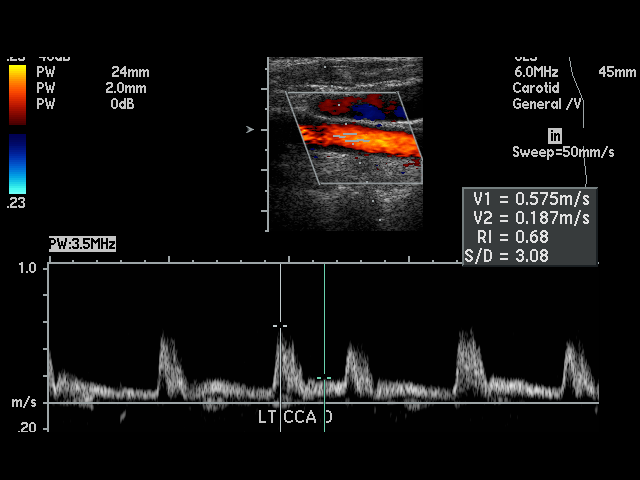
[im 71/82]
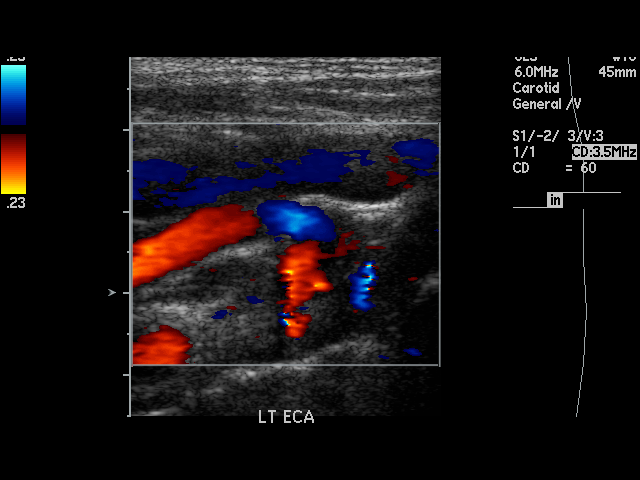
[im 74/82]
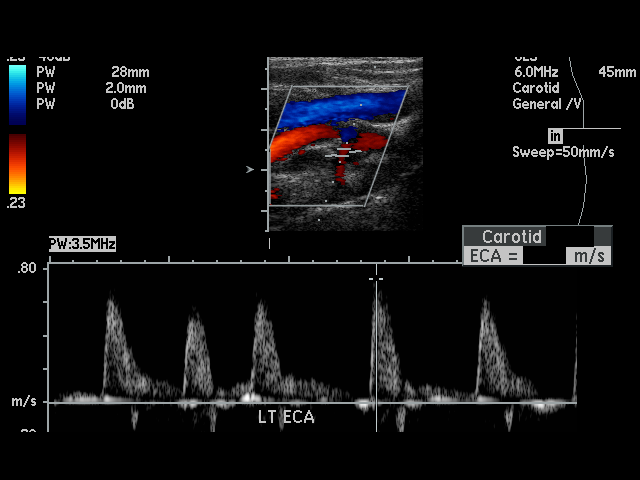
[im 82/82]
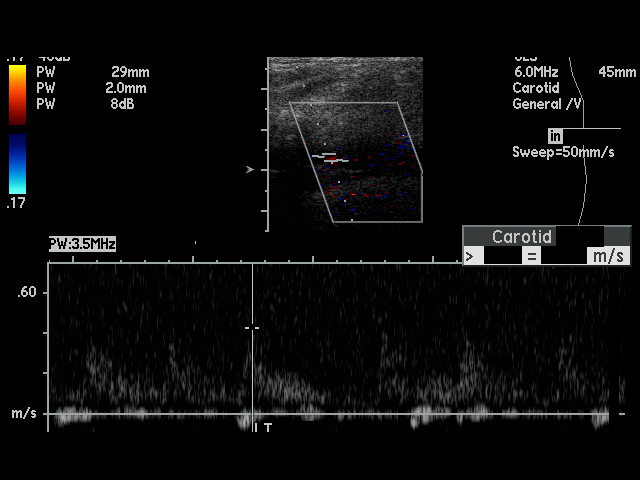

[17 of 24 positions shown; findings below may reference images not displayed]

PROCEDURE:     US  - US CAROTID DOPPLER BILATERAL  - March 02, 2012  [DATE]

RESULT:     Standard carotid color-flow duplex Doppler obtained. Calcified
plaque noted at both carotid bifurcations with degree of stenosis less than
50% bilaterally. Peak systolic flow velocity ratio right 0.9 on the left
0.6. Vertebrals are patent with antegrade flow. Left external carotid artery
appears occluded.
IMPRESSION: No hemodynamically significant stenosis.

## 2013-04-05 IMAGING — CR DG CHEST 1V PORT
1 series · 1 of 1 positions shown · non-contrast
Comparison: none

REASON FOR EXAM: ShOB / chest tightness /
COMMENTS:

PROCEDURE:     DXR - DXR PORTABLE CHEST SINGLE VIEW  - March 06, 2012  [DATE]
RESULT:
Frontal view of the chest is performed. Comparison is made to a prior study
dated 03/01/2012.

[portable]
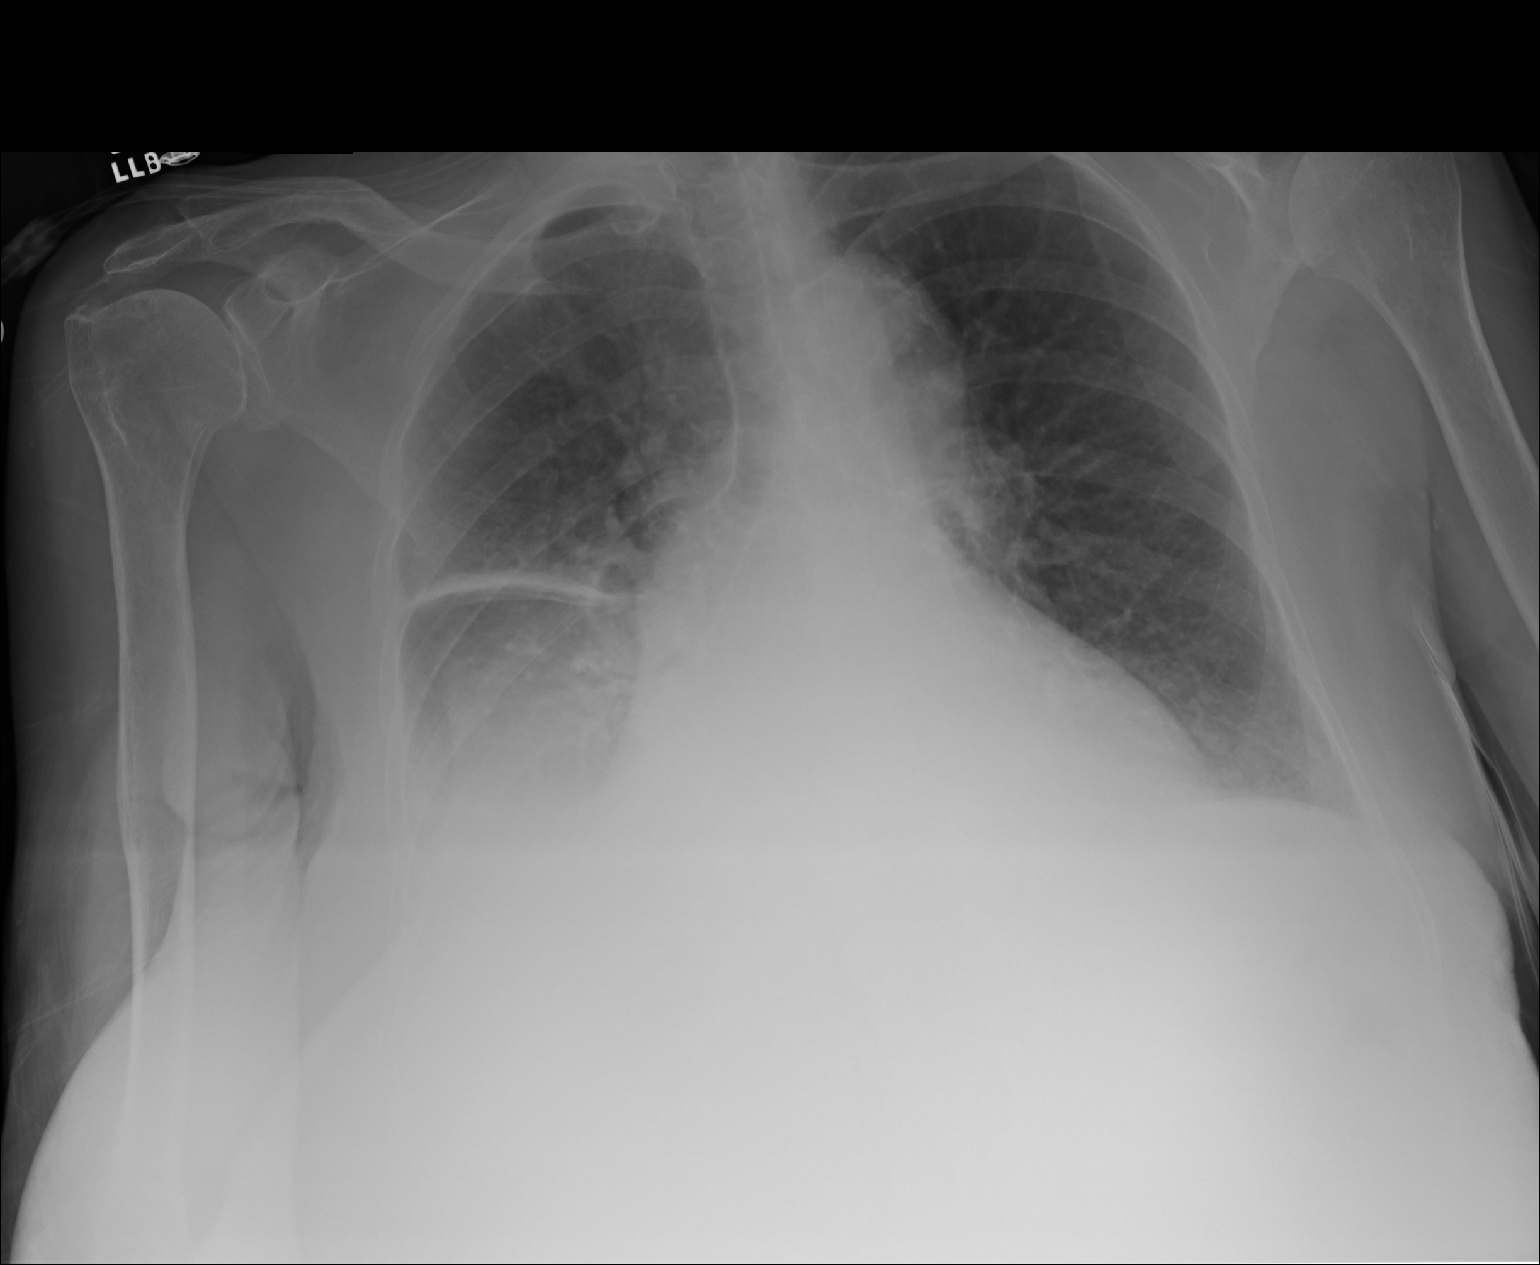

[1 of 1 positions shown; findings below may reference images not displayed]

FINDINGS: There is thickening of the interstitial markings and peribronchial
cuffing. Increased density projects within the right lung base decreasing
conspicuity when compared to the previous study though demonstrating a
meniscal apex. Fluid versus thickening is identified along the minor
fissure. The cardiac silhouet4te is enlarged. The aorta is tortuous. The
visualized bony skeleton is grossly unremarkable.
IMPRESSION: 1.  Interstitial infiltrate likely representing pulmonary edema.
2.  Atelectasis versus infiltrate, right lung base, as well as possibly a
small effusion.
3.  Fluid versus thickening along the minor fissure. Surveillance evaluation
is recommended.

## 2015-03-14 NOTE — Consult Note (Signed)
Reason for consultation:  Incidental left renal mass52 yo female found unresponsive at home and admitted for syncopal episode.  Patient is with family in room and family gives most of the history.  No significant PMH but has experienced worsening LE edema and SOB over the last few weeks.  Found to be in afib in the ER and most likely a component of CHF.chest was completed and incidental 5.8 cm upper pole left renal mass.  Urology was consulted. patient denies abdominal pain or hematuria.  No GU issues and has never been evaluated by urology in the past.   CURRENT MEDICATION: Herbal supplement over-the-counter for diuresis started five days ago once a day usage.  HISTORY: Lives at home by herself, but daughter who lives close by mostly lives with her mom. No history of any smoking or alcohol use currently.  HISTORY: Cancer runs in the family. OF SYSTEMS: CONSTITUTIONAL: No fever, fatigue, or weakness. Denies weight loss EYES: Decreased vision in the right eye. No glaucoma or cataracts, not known because has not been to physician. ENT: No tinnitus or ear pain, discharge, or snoring. Mild hearing loss. RESPIRATORY: No cough, wheeze, hemoptysis, or chronic obstructive pulmonary disease. CARDIOVASCULAR: No chest pain, orthopnea, or edema. Positive for arrhythmia. GASTROINTESTINAL: No nausea, vomiting, diarrhea, abdominal pain, or hematemesis. GENITOURINARY: No dysuria, hematuria, renal calculus or frequency. ENDOCRINE: No polyuria, nocturia, thyroid problems, heat or cold intolerance. HEMATOLOGY: No anemia, easy bruising or bleeding. SKIN: No acne, rash or lesions. MUSCULOSKELETAL: The patient denies any neck, back, shoulder pain or arthritis. NEUROLOGICAL: No cerebrovascular accident, transient ischemic attack or seizures. PSYCHOLOGICAL: No anxiety, insomnia, or depression.    Vital Signs Type : Routine Pulse : 91  Respirations : 41  Systolic BP : 102 BP (mmHg) : 67 BP : 85 mm HgOx % : 97 Ox Activity Level   : At restDelivery : 4L,  Nasal CannulaOx Heart Rate : 80  NAD, elderely femaleglassesunlabored breathingsoft, NT/ND1+pitting edema 0.9neg nitrite, neg leuk est, 5 RBC/hpf  Radiology: central airways are patent. There is a moderate right pleural  with associated compressive atelectasis. There is a small left effusion. There is no pneumothorax. are no pathologically enlarged axillary, hilar, or mediastinal nodes.   heart size is normal. There is no pericardial effusion. The thoracic is normal in caliber.  There is coronary artery atherosclerosis the left main, LAD and circumflex coronary artery. of bone windows demonstrates no focal lytic or sclerotic lesions.  noncontrast images of the upper abdomen were obtained. The glands appear normal. There is a 5.8 x 4.8 cm heterogeneous left upper pole renal mass most concerning for renal cell    Moderate right pleural effusion and small left pleural effusion with compressive atelectasis. There is a 5.8 x 4.8 cm heterogeneous enhancing left upper pole renal most concerning for renal cell carcinoma. 81 yo female with incidentally discovered left renal mass.plan with family and they are not interested in pursuing aggressive treatment at this time.  I discussed with them that they could further discuss this in an outpatient setting. There is no urgency at this time since the patient is asymptomatic and not having hematuria, bleeding or pain from the renal mass.  We discussed that this is most likely renal cell carcinoma because of the size and radiology description.  Urology will be available for any further questions    Electronic Signatures: Margy Clarks (MD)  (Signed on 13-Apr-13 14:51)  Authored  Last Updated: 13-Apr-13 14:51 by Margy Clarks (MD)

## 2015-03-14 NOTE — H&P (Signed)
PATIENT NAME:  Elizabeth Glenn, Elizabeth Glenn MR#:  [id number] DATE OF BIRTH:  [birth year]  DATE OF ADMISSION:  03/01/2012  ADMITTING PHYSICIAN: Dr. Gladstone Lighter.  PRIMARY CARE PHYSICIAN: None.   CHIEF COMPLAINT: Syncope.   HISTORY OF PRESENT ILLNESS: Elizabeth Glenn is a 81 year old Caucasian female with no significant past medical history who has not visited a physician in the past 60 years who presents to the hospital after she had a syncopal episode at home. The patient is very slow to talk and most of the history is obtained from her daughter who lives with her. According to the patient's daughter, the patient was fine up until last night when she went to bed. At around 11:30 this morning, daughter checked on the patient. She was still sleeping for most of the time and then daughter heard a weird noise and went back to check on her. The patient was sitting by the edge of the bed. The chair which was supposed to be by the side of the bed is thrown to the floor and the patient was not responding. When the daughter tried to shake the patient, the patient fell backwards onto the bed. She was brought to the Emergency Room. Her blood pressure was found to be 254/130 and she was in atrial fibrillation with RVR without prior history. Her heart rate was in 170s. Once her blood pressure was controlled with nitro drip, she was also placed on Cardizem drip. That is when the patient started to become more awake and started talking and is at baseline currently.   According to daughter, the patient has been having peripheral edema, mostly of her extremities going on for a few weeks now, so they have been trying over the counter herbal supplement for diuresis and slowly the swelling has been getting better.   PAST MEDICAL HISTORY: None.   PAST SURGICAL HISTORY: Hysterectomy.   ALLERGIES: No known drug allergies.   CURRENT MEDICATION: Herbal supplement over-the-counter for diuresis started five days ago once a day usage.   SOCIAL  HISTORY: Lives at home by herself, but daughter who lives close by mostly lives with her mom. No history of any smoking or alcohol use currently.   FAMILY HISTORY: Cancer runs in the family.  REVIEW OF SYSTEMS: CONSTITUTIONAL: No fever, fatigue, or weakness. EYES: Decreased vision in the right eye. No glaucoma or cataracts, not known because has not been to physician. ENT: No tinnitus or ear pain, discharge, or snoring. Mild hearing loss. RESPIRATORY: No cough, wheeze, hemoptysis, or chronic obstructive pulmonary disease. CARDIOVASCULAR: No chest pain, orthopnea, or edema. Positive for arrhythmia. GASTROINTESTINAL: No nausea, vomiting, diarrhea, abdominal pain, or hematemesis. GENITOURINARY: No dysuria, hematuria, renal calculus or frequency. ENDOCRINE: No polyuria, nocturia, thyroid problems, heat or cold intolerance. HEMATOLOGY: No anemia, easy bruising or bleeding. SKIN: No acne, rash or lesions. MUSCULOSKELETAL: The patient denies any neck, back, shoulder pain or arthritis. NEUROLOGICAL: No cerebrovascular accident, transient ischemic attack or seizures. PSYCHOLOGICAL: No anxiety, insomnia, or depression.   PHYSICAL EXAMINATION:  VITAL SIGNS: Temperature 97.2 degrees Fahrenheit, pulse 179, respirations 28, blood pressure 254/130, pulse oximetry 92% on 2 liters oxygen.   GENERAL: Well-built, well-nourished elderly female sitting in bed, not in any acute distress.   HEENT: Normocephalic, atraumatic. There is soft tissue 3 x 3 cm outgrowth seen on the forehead, has been there for 10 years, slowly increasing in size according to family. The right eye is more tearful and decreased intraocular pressure and decreased vision. Extraocular movements are intact. Pupils are  equal, round, reacting to light. Oropharynx clear without erythema, mass or exudates.   NECK: Supple. No thyromegaly, JVD, or carotid bruits. No lymphadenopathy.   LUNGS: She is moving air bilaterally. Decreased breath sounds at right  posterior base. No wheeze or crackles. No use of accessory muscles for breathing. Poor inspiratory effort.   CARDIOVASCULAR: S1, S2, irregular rhythm. No murmurs, rubs, or gallops.   ABDOMEN: Soft, nontender, nondistended. No hepatosplenomegaly.   EXTREMITIES: She does have chronic 3+ edema with skin changes. Chronic stasis dermatitis changes seen in the lower one-third of the legs bilaterally. Difficult to palpate any dorsalis pedis pulses. No clubbing or cyanosis.   SKIN: Other than the stasis dermatitis changes on the lower extremities, no other skin rash.   LYMPHATICS: No cervical lymphadenopathy or inguinal lymphadenopathy.   NEUROLOGIC: Cranial nerves seem to be intact and no focal motor or sensory deficits. She is able to move all four extremities.   PSYCHOLOGIC: She is awake, alert, and oriented x3.   LABORATORY, RADIOLOGICAL AND DIAGNOSTIC DATA: WBC 6.3, hemoglobin 12.8, hematocrit 34.9, platelet count 324. Sodium 135, potassium 4.1, chloride 95, bicarbonate 30, BUN 13, creatinine 0.81, glucose 118, calcium 8.9. ALT 24, AST 34, alkaline phosphatase 90, total bilirubin 0.9, albumin 3.5, troponin 0.02. INR is 1.0. Alcohol level is negative. Urinalysis negative for any infection. Urine tox screen is negative. BNP is 2,800. CK and CK-MB within normal limits. ABG showing pH of 7.34, pCO2 53, oxygen of 251 and bicarbonate 28.6, sat 99% on nonrebreather mask. Chest x-ray showed increased density at right base, maybe pleural effusion. However, underlying infiltrate or atelectasis cannot be excluded. CT of the head showing involutional changes. No evidence of any acute intracranial abnormalities. There is a soft nodule projecting in the frontal region. EKG showing atrial fibrillation. Initial EKG, heart rate of 177. Repeat EKG, heart rate of 111.   ASSESSMENT AND PLAN: This is a 81 year old female with no significant past medical history who has not seen a physician for several years comes in after  a syncopal episode, found to be in hypertensive urgency, blood pressure of 254/130 and heart rate of 179 with atrial fibrillation with RVR. 1. Syncope, likely cardiogenic in nature from her atrial fibrillation with rapid ventricular response. CT of the head is negative. Follow-up carotid Doppler's and also neuro checks.  2. Atrial fibrillation with rapid ventricular response triggered by hypertension or congestive heart failure, currently on Cardizem drip. Echo ordered. We will try to wean her off Cardizem drip and start p.o. Cardizem every six hours and also p.o. metoprolol. Cardiology consulted. Will hold off on giving IV anticoagulation with heparin because she might have an intracranial bleed with such high blood pressure.  3. Peripheral edema, probably has underlying congestive heart failure, either diastolic or systolic dysfunction. Echo has been ordered, started on Lasix 40 b.i.d. and chest x-ray with right pleural effusion. Follow-up CT of chest to rule out infiltrate.  4. Hypertensive urgency. The patient is on nitro drip, wean as tolerated, started on p.o. medications including lisinopril, metoprolol, Lasix and also on Cardizem. 5. Soft tissue growth on the forehead. Surgical consult for possible biopsy. If cannot be done,  then the patient might need a dermatology consult as an outpatient.  6. Deep vein thrombosis prophylaxis. I will place on Lovenox just for prophylactic dose for now. Once her blood pressure improves, she can be changed over to oral anticoagulation with either Xarelto or eliquis at the time of discharge.  7. CODE STATUS: FULL CODE.  TOTAL CRITICAL CARE TIME SPENT: 60 minutes.   ____________________________ Gladstone Lighter, MD rk:ap D: 03/01/2012 12:57:39 ET T: 03/01/2012 13:40:52 ET JOB#: [id number]  cc: Gladstone Lighter, MD, <Dictator> Corey Skains, MD Gladstone Lighter MD ELECTRONICALLY SIGNED 03/01/2012 14:27

## 2015-03-14 NOTE — Consult Note (Signed)
PATIENT NAME:  Elizabeth Glenn, BULTEMA MR#:  [id number] DATE OF BIRTH:  1913-12-10  DATE OF CONSULTATION:  03/01/2012  REFERRING PHYSICIAN:  Dr. Gladstone Lighter CONSULTING PHYSICIAN:  Corey Skains, MD  REASON FOR CONSULTATION: Atrial fibrillation with syncope.   CHIEF COMPLAINT: "I fell."  HISTORY OF PRESENT ILLNESS: This 81 year old female with no significant previous cardiovascular history who has had new onset of falling when the patient got out of bed in the morning. The patient was supposed to be beside the bed but was in the floor and not responding. The patient did respond afterwards with significant hypertension, atrial fibrillation with rapid ventricular rate and congestive heart failure-type symptoms. The patient was placed on appropriate medications for heart rate control and currently has a heart rate of approximately 88 beats per minute and is reasonably feeling well. The patient has suffered some damage to her forehead. The patient currently has not had any other telemetry changes. She does have some peripheral edema and hypertensive urgency which has been treated with intravenous Lasix. She additionally has had BNP of 2800, hemoglobin of 12.8 and no evidence of acute myocardial infarction by troponin.   REVIEW OF SYSTEMS: The remainder review of systems negative for vision change, ringing in the ears, hearing loss, cough, congestion, heartburn, nausea, vomiting, diarrhea, bloody stools, stomach pain, extremity pain, leg weakness, cramping of the buttocks, known blood clots, headaches, dizzy spells, nosebleed, congestion, trouble swallowing, frequent urination, urination at night, muscle weakness, numbness, anxiety, depression, skin lesions, skin rashes.   PAST MEDICAL HISTORY: None.   FAMILY HISTORY: No family members with early onset of cardiovascular disease.   SOCIAL HISTORY: Currently denies alcohol or tobacco use.   ALLERGIES: No known drug allergies.   CURRENT MEDICATIONS: As  listed.   PHYSICAL EXAMINATION:  VITAL SIGNS: Blood pressure 168/80 bilaterally, heart rate is 100 upright and irregular.  GENERAL: She is a well-appearing elderly female in no acute distress.   HEENT: No icterus, thyromegaly, ulcers, xanthelasma.   CARDIOVASCULAR: Irregularly irregular. Normal S1 and S2, 2/6 apical murmur consistent with mitral regurgitation. Point of maximal impulse is diffuse. Carotid upstroke normal without bruit. Jugular venous pressure normal.   LUNGS: Lungs have bibasilar crackles with normal respirations.   ABDOMEN: Soft, nontender without hepatosplenomegaly or masses. Abdominal aorta is normal size without bruit.   EXTREMITIES: 2+ bilateral pulses in dorsal, pedal, radial, and femoral arteries without lower extremity edema, cyanosis, clubbing, ulcers.   NEUROLOGIC: She is oriented to time, place, and person with normal mood and affect.   ASSESSMENT: 81 year old female with atrial fibrillation, rapid ventricular rate, mild congestive heart failure, syncope and abnormal EKG without evidence of myocardial infarction.   RECOMMENDATIONS:  1. Continue medication management including calcium channel blocker for heart rate control with goal heart rate below 100 beats per minute.  2. Anticoagulation for further risk reduction and stroke if patient has no considerable bleeding risks with fall.  3. Echocardiogram for LV systolic dysfunction, valvular heart disease and further treatment options. 4. Serial ECG and enzymes for possible myocardial infarction.  5. Further treatment options with ambulation.   ____________________________ Corey Skains, MD bjk:cms D: 03/01/2012 16:28:48 ET T: 03/02/2012 12:57:23 ET  JOB#: [id number] cc: Corey Skains, MD, <Dictator> Corey Skains MD ELECTRONICALLY SIGNED 03/03/2012 7:55

## 2015-03-14 NOTE — Discharge Summary (Signed)
PATIENT NAME:  Elizabeth Glenn, Elizabeth Glenn MR#:  [id number] DATE OF BIRTH:  [birth year]  DATE OF ADMISSION:  03/01/2012 DATE OF DISCHARGE:  03/05/2012   For a detailed note, please take a look at the history and physical on admission by Dr. Tressia Miners.    DIAGNOSES AT DISCHARGE:  1. Acute respiratory failure likely secondary to a combination of congestive heart failure and chronic obstructive pulmonary disease.  2. Congestive heart failure, diastolic dysfunction. 3. Atrial fibrillation, new onset. 4. Chronic obstructive pulmonary disease with history of tobacco abuse. 5. Soft tissue forehead mass.  6. Chronic left renal mass, suspected renal cell carcinoma but patient and family not requesting any treatment or work-up.   DIET: The patient is being discharged on a regular diet.   ACTIVITY: As tolerated.   FOLLOW-UP: Follow-up with Dr. Serafina Royals in the next 1 to 2 weeks.   DISPOSITION: The patient is currently being discharged to a skilled nursing facility at [facility]   DISCHARGE MEDICATIONS:  1. Aspirin 81 mg daily.  2. Lasix 20 mg daily.  3. Potassium 20 mEq daily.   4. Lisinopril 5 mg daily.  5. Cardizem CD 120 mg daily.  6. Albuterol inhaler 2 puffs q.4 to 6 hours p.r.n.  7. DuoNebs 3 mL q.6 hours p.r.n. shortness of breath.  8. Prednisone taper over the next two days 20 mg x1 day and then 10 mg x1 day.    [place] COURSE:  1. Dr. Serafina Royals from Cardiology  2. Dr. Bronson Ing from General Surgery  3. Dr. Verdie Shire from Urology    [place]: CT scan of the head done without contrast showing involutional changes, scalp nodule projecting in the frontal region.   CT of the chest done without contrast showing moderate right-sided pleural effusion, small left pleural effusion with associated compressive atelectasis. A 5.8 x 4.8 cm heterogeneous enhancing left upper pole renal mass concerning for renal cell carcinoma.    Carotid ultrasound done showing no hemodynamically significant carotid stenosis.   Two-dimensional echocardiogram done showing left ventricular systolic function to be normal, EF 65%, left atrium severely dilated, right atrium severely dilated, severe mitral regurgitation and severe tricuspid regurgitation.   HOSPITAL COURSE: This is a 81 year old female with medical problems as mentioned above who presented to the hospital on 03/01/2012 secondary to worsening lower peripheral edema, elevated heart rate, and hypertensive urgency.  1. Syncope. The patient's syncope was likely related to a cardiogenic syncope from her being in rapid atrial fibrillation. She had a CT of the head which was negative. She was not noted to be orthostatic. Her cardiac enzymes x3 were negative. She had no further episodes of syncope during the hospital course once her heart rate was controlled. She had an echocardiogram done which showed no thrombus and a carotid duplex which showed no hemodynamically significant carotid stenosis.  2. Atrial fibrillation. This was new onset for the patient. Initially the patient was admitted to the Intensive Care Unit and placed on a Cardizem drip. Eventually the patient was taken off the Cardizem drip and is currently on Cardizem CD and heart rates are under fair control. She is a high risk for being on anti-coagulation given her increasing age and high fall risk. Therefore, she will continue aspirin for now.  3. Acute respiratory failure. This was likely secondary to a combination of COPD and CHF. The patient presented with congestive heart failure secondary to rapid atrial fibrillation. She was diuresed well  with IV diuretics. Her clinical symptoms and shortness of breath have improved. She is currently being discharged on low dose diuretics. Also, she has a history of smoking, underlying tobacco abuse, therefore some of her shortness of breath was related to COPD. She received a quick  prednisone taper and some DuoNebs. She will continue to finish her prednisone taper, continue DuoNebs and albuterol inhaler as needed.  4. Hypertensive urgency. When the patient presented to the hospital, her systolic blood pressures were over 200's. She was started on a nitroglycerin drip and given aggressive antihypertensives. Her blood pressure since then has significantly improved. She currently is being maintained on just Cardizem CD and low dose ACE inhibitor which she will continue.  5. Left forehead soft tissue mass. The patient has had a protruding soft tissue mass on her left forehead for many years. Surgical consult was obtained. The patient was seen by Dr. Pat Patrick. After his evaluation he explained to the family this would be a difficult excision and the patient would probably need further plastic surgery but given her advanced age, family opted not for any further treatment at this point.  6. Left renal mass suspicious for renal cell carcinoma. The patient's creatinine is normal. She has no hematuria. The family does not want to pursue any further work-up or treatment. The patient was seen by Urology who recommended getting a CT and further work-up but the family was not interested in that presently.   CODE STATUS: The patient presently is a DO NOT INTUBATE, DO NOT RESUSCITATE.   DISPOSITION: She is being discharged to a skilled nursing facility for further care and rehab.   TIME SPENT WITH THE DISCHARGE: 40 minutes.   ____________________________ Belia Heman. Verdell Carmine, MD vjs:drc D: 03/05/2012 13:20:22 ET T: 03/05/2012 13:41:56 ET JOB#: [id number]  cc: Belia Heman. Verdell Carmine, MD, <Dictator> Corey Skains, MD Henreitta Leber MD ELECTRONICALLY SIGNED 03/06/2012 16:17
